# Patient Record
Sex: Male | Born: 1970
Health system: Southern US, Community
[De-identification: ages and names within clinical notes are randomized; demographics above are authoritative.]

## PROBLEM LIST (undated history)

## (undated) DIAGNOSIS — K760 Fatty (change of) liver, not elsewhere classified: Secondary | ICD-10-CM

## (undated) DIAGNOSIS — R7989 Other specified abnormal findings of blood chemistry: Secondary | ICD-10-CM

## (undated) DIAGNOSIS — N200 Calculus of kidney: Secondary | ICD-10-CM

## (undated) DIAGNOSIS — K589 Irritable bowel syndrome without diarrhea: Secondary | ICD-10-CM

## (undated) DIAGNOSIS — E669 Obesity, unspecified: Secondary | ICD-10-CM

## (undated) DIAGNOSIS — K219 Gastro-esophageal reflux disease without esophagitis: Secondary | ICD-10-CM

## (undated) DIAGNOSIS — E785 Hyperlipidemia, unspecified: Secondary | ICD-10-CM

## (undated) DIAGNOSIS — R0683 Snoring: Secondary | ICD-10-CM

## (undated) HISTORY — DX: Snoring: R06.83

## (undated) HISTORY — DX: Obesity, unspecified: E66.9

## (undated) HISTORY — DX: Calculus of kidney: N20.0

## (undated) HISTORY — PX: NO PAST SURGERIES: SHX2092

## (undated) HISTORY — DX: Hyperlipidemia, unspecified: E78.5

## (undated) HISTORY — DX: Fatty (change of) liver, not elsewhere classified: K76.0

## (undated) HISTORY — DX: Other specified abnormal findings of blood chemistry: R79.89

## (undated) HISTORY — DX: Irritable bowel syndrome, unspecified: K58.9

## (undated) HISTORY — DX: Gastro-esophageal reflux disease without esophagitis: K21.9

---

## 2005-07-03 ENCOUNTER — Ambulatory Visit: Payer: Self-pay | Admitting: Gastroenterology

## 2005-07-16 ENCOUNTER — Ambulatory Visit: Payer: Self-pay | Admitting: Gastroenterology

## 2005-07-30 ENCOUNTER — Ambulatory Visit: Payer: Self-pay | Admitting: Gastroenterology

## 2005-08-06 ENCOUNTER — Ambulatory Visit: Payer: Self-pay | Admitting: Gastroenterology

## 2005-08-06 HISTORY — PX: COLONOSCOPY: SHX174

## 2007-06-24 ENCOUNTER — Ambulatory Visit: Payer: Self-pay | Admitting: Gastroenterology

## 2007-06-24 LAB — CONVERTED CEMR LAB
ALT: 42 U/L
AST: 26 U/L
Albumin: 4 g/dL
Alkaline Phosphatase: 90 U/L
Amylase: 203 U/L — ABNORMAL HIGH
BUN: 14 mg/dL
Basophils Absolute: 0.1 K/uL
Basophils Relative: 0.7 %
Bilirubin, Direct: 0.1 mg/dL
CO2: 30 meq/L
Calcium: 9.3 mg/dL
Chloride: 106 meq/L
Creatinine, Ser: 1 mg/dL
Eosinophils Absolute: 1.1 K/uL — ABNORMAL HIGH
Eosinophils Relative: 14.7 % — ABNORMAL HIGH
Ferritin: 126.5 ng/mL
Folate: 13.7 ng/mL
GFR calc Af Amer: 108 mL/min
GFR calc non Af Amer: 89 mL/min
Glucose, Bld: 112 mg/dL — ABNORMAL HIGH
H Pylori IgG: POSITIVE — AB
HCT: 40.2 %
Hemoglobin: 14.6 g/dL
Iron: 92 ug/dL
Lipase: 21 U/L
Lymphocytes Relative: 34.4 %
MCHC: 36.2 g/dL
MCV: 93.9 fL
Monocytes Absolute: 0.5 K/uL
Monocytes Relative: 6.8 %
Neutro Abs: 3.1 K/uL
Neutrophils Relative %: 43.4 %
Platelets: 179 K/uL
Potassium: 3.5 meq/L
RBC: 4.28 M/uL
RDW: 11.3 % — ABNORMAL LOW
Saturation Ratios: 27 %
Sed Rate: 8 mm/h
Sodium: 142 meq/L
TSH: 0.84 u[IU]/mL
Total Bilirubin: 0.9 mg/dL
Total Protein: 7.6 g/dL
Transferrin: 243.1 mg/dL
Vitamin B-12: 480 pg/mL
WBC: 7.3 10*3/microliter

## 2007-07-07 ENCOUNTER — Ambulatory Visit: Payer: Self-pay | Admitting: Cardiology

## 2008-01-30 DIAGNOSIS — Z8719 Personal history of other diseases of the digestive system: Secondary | ICD-10-CM | POA: Insufficient documentation

## 2008-01-30 DIAGNOSIS — K589 Irritable bowel syndrome without diarrhea: Secondary | ICD-10-CM | POA: Insufficient documentation

## 2009-05-21 ENCOUNTER — Emergency Department (HOSPITAL_COMMUNITY): Admission: EM | Admit: 2009-05-21 | Discharge: 2009-05-21 | Payer: Self-pay | Admitting: Emergency Medicine

## 2010-11-28 NOTE — Procedures (Signed)
Summary: Gastroenterology colon  Gastroenterology colon   Imported By: Milford Cage CMA 01/30/2008 11:12:03  _____________________________________________________________________  External Attachment:    Type:   Image     Comment:   External Document

## 2010-11-28 NOTE — Procedures (Signed)
Summary: Gastroenterology EGD  Gastroenterology EGD   Imported By: Milford Cage CMA 01/30/2008 11:12:41  _____________________________________________________________________  External Attachment:    Type:   Image     Comment:   External Document

## 2011-02-04 LAB — URINE MICROSCOPIC-ADD ON

## 2011-02-04 LAB — URINALYSIS, ROUTINE W REFLEX MICROSCOPIC
Bilirubin Urine: NEGATIVE
Glucose, UA: NEGATIVE mg/dL
Ketones, ur: NEGATIVE mg/dL
Leukocytes, UA: NEGATIVE
Nitrite: NEGATIVE
Protein, ur: NEGATIVE mg/dL
Specific Gravity, Urine: 1.024 (ref 1.005–1.030)
Urobilinogen, UA: 0.2 mg/dL (ref 0.0–1.0)
pH: 6 (ref 5.0–8.0)

## 2011-02-04 LAB — URINE CULTURE
Colony Count: NO GROWTH
Culture: NO GROWTH

## 2011-03-13 NOTE — Assessment & Plan Note (Signed)
[facility]                         GASTROENTEROLOGY OFFICE NOTE   NAME:[name] [name]                     MRN:          [id number]  DATE:06/24/2007                            DOB:          [birth year]    I previously saw [name] [name] in September of 2006.  He currently is a  40 year old Hispanic male who has recurrent left-sided abdominal pain of  unexplained etiology with negative workup including endoscopy and  colonoscopy and upper abdominal ultrasound, along with multiple  laboratory parameters in 2006.  He at that time was treated empirically  with Prevacid and anti-spasmodics and apparently his left-sided  abdominal pain resolved.   He comes in today with his wife and is complaining of similar pain which  seemed to occur every several days and will last up to a day in  duration.  Described as a dull and crampy pain, without precipitating or  alleviating elements.  He surprisingly had no change in his bowel  pattern and denies melena or hematochezia.  Had some vague indigestion  but denies nausea and vomiting, anorexia, weight loss or any  hepatobiliary complaints.  His review of systems otherwise  noncontributory.  He denies abuse of cigarettes, alcohol, or NSAIDs.   As per my previous notes, his family history is noncontributory.   EXAMINATION:  He is a healthy-appearing Hispanic male appearing his  stated age in no acute distress.  He weighs 207 pounds which is up 6  pounds from his previous weight.  Blood pressure is 124/64.  Could not  appreciate stigmata of chronic liver disease.  ABDOMINAL:  Showed no organomegaly, masses, but there was slight  tenderness in the left lower quadrant over his sigmoid colon.  Bowel  sounds were normal.  RECTAL:  Exam was deferred.   ASSESSMENT:  [name] [name] seemed to have some tenderness over his  sigmoid colon but this is chronic in nature and he has had a negative  colonoscopy recently 2 years ago.   I suspect he has a variation of  irritable bowel syndrome.   RECOMMENDATIONS:  1. Check CT scan of abdomen and pelvis to complete his workup.  2. Screening laboratory parameters.  3. High-fiber diet with fiber supplements.  4. Pamine forte 5 mg q.a.m. and twice a day as needed.  5. Followup in several weeks time and we will proceed according to his      workup, clinical course.     Vania Rea. Jarold Motto, MD, Caleen Essex, FAGA  Electronically Signed    DRP/MedQ  DD: 06/24/2007  DT: 06/25/2007  Job #: [id number]

## 2012-12-03 ENCOUNTER — Encounter: Payer: Self-pay | Admitting: Physician Assistant

## 2013-04-29 NOTE — Progress Notes (Signed)
This encounter was created in error - please disregard.

## 2014-10-23 ENCOUNTER — Ambulatory Visit (INDEPENDENT_AMBULATORY_CARE_PROVIDER_SITE_OTHER): Payer: BC Managed Care – PPO | Admitting: Family Medicine

## 2014-10-23 VITALS — BP 130/80 | HR 79 | Temp 98.7°F | Resp 16 | Ht 67.25 in | Wt 224.0 lb

## 2014-10-23 DIAGNOSIS — J0100 Acute maxillary sinusitis, unspecified: Secondary | ICD-10-CM

## 2014-10-23 DIAGNOSIS — R05 Cough: Secondary | ICD-10-CM

## 2014-10-23 DIAGNOSIS — R059 Cough, unspecified: Secondary | ICD-10-CM

## 2014-10-23 MED ORDER — MUCINEX DM MAXIMUM STRENGTH 60-1200 MG PO TB12: 1.0000 | ORAL_TABLET | Freq: Two times a day (BID) | ORAL | Status: DC

## 2014-10-23 MED ORDER — IPRATROPIUM BROMIDE 0.03 % NA SOLN: 2.0000 | Freq: Two times a day (BID) | NASAL | Status: DC

## 2014-10-23 MED ORDER — AMOXICILLIN-POT CLAVULANATE 875-125 MG PO TABS: 1.0000 | ORAL_TABLET | Freq: Two times a day (BID) | ORAL | Status: DC

## 2014-10-23 NOTE — Patient Instructions (Signed)
Sinusitis (Sinusitis) La sinusitis es el enrojecimiento, el dolor y la inflamacin de los senos paranasales. Los senos paranasales son bolsas de aire que se encuentran dentro de los huesos de la cara (por debajo de los ojos, en la mitad de la frente o por encima de los ojos). En los senos paranasales sanos, el moco puede drenar y el aire circula a travs de ellos en su camino hacia la Lawyer. Sin embargo, cuando se [place], el moco y el aire [place]. Esto hace que se desarrollen bacterias y otros grmenes que causan infeccin. La sinusitis puede desarrollarse rpidamente y durar solo un tiempo corto (aguda) o continuar por un perodo largo (crnica). La sinusitis que dura ms de 12 semanas se considera crnica.  CAUSAS  Las causas de la sinusitis son:  Cualquier alergia que tenga.  Las [employer], como el desplazamiento del cartlago que separa las fosas nasales (desvo del tabique), que pueden disminuir el flujo de aire por la nariz y los senos paranasales, y Print production planner su drenaje.  Las anomalas funcionales, como cuando los pequeos pelos (cilias) que se encuentran en los senos paranasales y que ayudan a eliminar el moco no funcionan correctamente o no estn presentes. [place] sntomas de la sinusitis aguda y [place] son los mismos. Los sntomas principales son Conservation officer, historic buildings y la presin alrededor de los senos paranasales afectados. Otros sntomas son:  Chief of Staff.  Dolor de odos.  Dolor de [place].  Mal aliento.  Disminucin del sentido del olfato y del gusto.  Tos, que empeora al [employer].  Fatiga.  Cristy Hilts.  Drenaje de moco espeso por la nariz, que generalmente es de color verde y puede contener pus (purulento).  Hinchazn y calor en los senos paranasales afectados. DIAGNSTICO  Su mdico le Chartered certified accountant examen fsico. Durante el examen, el mdico:  Le revisar la nariz para buscar signos de crecimientos anormales en las  fosas nasales (plipos nasales).  Palpar los senos paranasales afectados para buscar signos de infeccin.  Ver el interior de los senos paranasales (endoscopia) a travs de un dispositivo de obtencin de imgenes que tiene una luz conectada (endoscopio). Si el mdico sospecha que usted sufre sinusitis crnica, podr indicar una o ms de las siguientes pruebas:  Pruebas de Buyer, retail.  Cultivo de las [employer]. Se extrae Truddie Coco de moco de la nariz, que se enva al laboratorio para detectar bacterias.  Citologa nasal. Se extrae Truddie Coco de moco de la nariz, que el mdico examina para determinar si la sinusitis est relacionada con Obie Dredge. TRATAMIENTO  La mayora de los casos de sinusitis aguda se deben a una infeccin viral y se resuelven espontneamente en un perodo de 10das. En algunos casos, se recetan medicamentos para [employer] (analgsicos, descongestivos, aerosoles nasales con corticoides o aerosoles salinos).  Sin embargo, para la sinusitis por infeccin bacteriana, Camera operator. Los antibiticos son medicamentos que destruyen las bacterias que causan la infeccin.  Con poca frecuencia, la sinusitis tiene su origen en una infeccin por hongos. En estos casos, el mdico recetar un medicamento antimictico. Para algunos casos de sinusitis crnica, es necesario someterse a [place]. Generalmente, se trata de [employer] la sinusitis se repite ms de 3veces al ao, a pesar de otros tratamientos. INSTRUCCIONES PARA EL CUIDADO EN EL HOGAR   Beber gran cantidad de lquidos. Los lquidos ayudan a [employer], para que drene ms fcilmente de los senos paranasales.  Use un humidificador.  Inhale vapor de 3 a 4 veces al da (por ejemplo, sintese en el bao con la ducha abierta).  Aplquese un pao tibio y hmedo en la cara 3 o 4 veces [place] indicaciones del mdico.  Use un aerosol nasal salino para ayudar  a Air cabin crew y [employer] senos nasales.  Tome los medicamentos solamente como se lo haya indicado el mdico.  Si le recetaron un antimictico o un antibitico, asegrese de terminarlos, incluso si comienza a sentirse mejor. SOLICITE ATENCIN MDICA DE INMEDIATO SI:  Siente ms dolor o sufre dolores de cabeza intensos.  Tiene nuseas, vmitos o somnolencia.  Observa hinchazn alrededor del rostro.  Tiene problemas de visin.  Presenta rigidez en el cuello.  Tiene dificultad para respirar. ASEGRESE DE QUE:   Comprende estas instrucciones.  Controlar su afeccin.  Recibir ayuda de inmediato si no mejora o si empeora. Document Released: 07/25/2005 Document Revised: 03/01/2014 [facility] Patient Information 2015 [place]. This information is not intended to replace advice given to you by your health care provider. Make sure you discuss any questions you have with your health care provider.

## 2014-10-23 NOTE — Progress Notes (Signed)
Subjective:    Patient ID: [name] [name], male    DOB: 08-Dec-1970, 43 y.o.   MRN: [id number]  10/23/2014  Sore Throat and Cough   HPI This 43 y.o. male presents for evaluation of sore throat and cough.  Started with sore throat and now coughing.  No fever but +chills/sweats in begninning.  +HA.  Decreased hearing.  No rhinorrhea; +nasal congestion.  +coughing; +sputum.  +wheezing.  No SOB.  No v/d.  Dayquil and Nyquil without improvement.  No improvement.  No tobacco.  Drainage from L ear one night; no bloody drainage.  Hearing decreased after drainage from L ear.  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    Three children at home are sick.     Review of Systems  Constitutional: Positive for chills, diaphoresis and fatigue. Negative for fever.  HENT: Positive for congestion, ear discharge, hearing loss, postnasal drip and sore throat. Negative for rhinorrhea, sinus pressure, trouble swallowing and voice change.   Respiratory: Positive for cough and wheezing. Negative for shortness of breath.   Gastrointestinal: Negative for nausea, vomiting, abdominal pain and diarrhea.  Skin: Negative for rash.  Neurological: Positive for headaches.    History reviewed. No pertinent past medical history. History reviewed. No pertinent past surgical history. No Known Allergies      Objective:    BP 130/80 mmHg  Pulse 79  Temp(Src) 98.7 F (37.1 C) (Oral)  Resp 16  Ht 5' 7.25" (1.708 m)  Wt 224 lb (101.606 kg)  BMI 34.83 kg/m2  SpO2 95% Physical Exam  Constitutional: He is oriented to person, place, and time. He appears well-developed and well-nourished. No distress.  HENT:  Head: Normocephalic and atraumatic.  Right Ear: External ear normal. Tympanic membrane is not perforated, not erythematous, not retracted and not bulging.  Left Ear: Tympanic membrane is retracted. Tympanic membrane is not perforated, not erythematous and not bulging.  Nose: Mucosal edema and rhinorrhea present. Right sinus  exhibits maxillary sinus tenderness. Right sinus exhibits no frontal sinus tenderness. Left sinus exhibits maxillary sinus tenderness. Left sinus exhibits no frontal sinus tenderness.  Mouth/Throat: Oropharynx is clear and moist. No oropharyngeal exudate.  Eyes: Conjunctivae and EOM are normal. Pupils are equal, round, and reactive to light.  Neck: Normal range of motion. Neck supple. Carotid bruit is not present. No thyromegaly present.  Cardiovascular: Normal rate, regular rhythm, normal heart sounds and intact distal pulses.  Exam reveals no gallop and no friction rub.   No murmur heard. Pulmonary/Chest: Effort normal and breath sounds normal. He has no wheezes. He has no rales.  Lymphadenopathy:    He has no cervical adenopathy.  Neurological: He is alert and oriented to person, place, and time. No cranial nerve deficit.  Skin: Skin is warm and dry. No rash noted. He is not diaphoretic.  Psychiatric: He has a normal mood and affect. His behavior is normal.  Nursing note and vitals reviewed.       Assessment & Plan:   1. Acute maxillary sinusitis, recurrence not specified   2. Cough     Meds ordered this encounter  Medications  . amoxicillin-clavulanate (AUGMENTIN) 875-125 MG per tablet    Sig: Take 1 tablet by mouth 2 (two) times daily.    Dispense:  20 tablet    Refill:  0  . ipratropium (ATROVENT) 0.03 % nasal spray    Sig: Place 2 sprays into the nose 2 (two) times daily.    Dispense:  30 mL    Refill:  0  . Dextromethorphan-Guaifenesin (MUCINEX DM MAXIMUM STRENGTH) 60-1200 MG TB12    Sig: Take 1 tablet by mouth every 12 (twelve) hours.    Dispense:  20 each    Refill:  0    No Follow-up on file.    Reginia Forts, M.D.  [facility] [address] [place], [place]  [address] [phone] phone [phone] fax

## 2014-12-01 ENCOUNTER — Ambulatory Visit (INDEPENDENT_AMBULATORY_CARE_PROVIDER_SITE_OTHER): Payer: 59

## 2014-12-01 ENCOUNTER — Ambulatory Visit (INDEPENDENT_AMBULATORY_CARE_PROVIDER_SITE_OTHER): Payer: 59 | Admitting: Emergency Medicine

## 2014-12-01 VITALS — BP 132/82 | HR 72 | Temp 97.8°F | Resp 18 | Ht 68.0 in | Wt 223.0 lb

## 2014-12-01 DIAGNOSIS — R059 Cough, unspecified: Secondary | ICD-10-CM

## 2014-12-01 DIAGNOSIS — R05 Cough: Secondary | ICD-10-CM

## 2014-12-01 LAB — POCT CBC
Granulocyte percent: 62.3 %G (ref 37–80)
HCT, POC: 46.3 % (ref 43.5–53.7)
Hemoglobin: 15.2 g/dL (ref 14.1–18.1)
Lymph, poc: 1.8 (ref 0.6–3.4)
MCH, POC: 32.3 pg — AB (ref 27–31.2)
MCHC: 32.9 g/dL (ref 31.8–35.4)
MCV: 98.1 fL — AB (ref 80–97)
MID (cbc): 0.5 (ref 0–0.9)
MPV: 8.8 fL (ref 0–99.8)
POC Granulocyte: 3.8 (ref 2–6.9)
POC LYMPH PERCENT: 30.3 %L (ref 10–50)
POC MID %: 7.4 %M (ref 0–12)
Platelet Count, POC: 198 10*3/uL (ref 142–424)
RBC: 4.71 M/uL (ref 4.69–6.13)
RDW, POC: 12.6 %
WBC: 6.1 10*3/uL (ref 4.6–10.2)

## 2014-12-01 MED ORDER — BENZONATATE 100 MG PO CAPS: 100.0000 mg | ORAL_CAPSULE | Freq: Three times a day (TID) | ORAL | Status: DC | PRN

## 2014-12-01 MED ORDER — AZITHROMYCIN 250 MG PO TABS: ORAL_TABLET | ORAL | Status: DC

## 2014-12-01 NOTE — Progress Notes (Signed)
Subjective:    Patient ID: [name] [name], male    DOB: 08-01-1971, 44 y.o.   MRN: [id number] This chart was scribed for [name] Queen, MD by Zola Button, Medical Scribe. This patient was seen in room 9 and the patient's care was started at 11:02 AM.   HPI HPI Comments: [place] [name] [name] is a 44 y.o. male who presents to the [facility] complaining of gradual onset URI symptoms that started 2 weeks ago. Patient reports having worsening productive cough with yellow/clear phlegm, congestion, and sore throat. He has been using Nyquil and Dayquil for his symptoms. He denies smoking. Patient was seen here by Dr. Tamala Julian and treated for sinusitis on 10/23/14 with improvement.  He is driving to [place], [place] today for work.   Review of Systems  HENT: Positive for congestion and sore throat.   Respiratory: Positive for cough.        Objective:   Physical Exam CONSTITUTIONAL: Well developed/well nourished. Alert and cooperative. HEAD: Normocephalic/atraumatic EYES: EOM/PERRL ENMT: Mucous membranes moist. Ears normal. Throat slightly red.  NECK: No adenopathy. SPINE: entire spine nontender CV: S1/S2 noted, no murmurs/rubs/gallops noted LUNGS: Occasional rhonchi, but good breath sounds. Clear to auscultation. ABDOMEN: soft, nontender, no rebound or guarding GU: no cva tenderness NEURO: Pt is awake/alert, moves all extremitiesx4 EXTREMITIES: pulses normal, full ROM SKIN: warm, color normal PSYCH: no abnormalities of mood noted  Results for orders placed or performed in visit on 12/01/14  POCT CBC  Result Value Ref Range   WBC 6.1 4.6 - 10.2 K/uL   Lymph, poc 1.8 0.6 - 3.4   POC LYMPH PERCENT 30.3 10 - 50 %L   MID (cbc) 0.5 0 - 0.9   POC MID % 7.4 0 - 12 %M   POC Granulocyte 3.8 2 - 6.9   Granulocyte percent 62.3 37 - 80 %G   RBC 4.71 4.69 - 6.13 M/uL   Hemoglobin 15.2 14.1 - 18.1 g/dL   HCT, POC 46.3 43.5 - 53.7 %   MCV 98.1 (A) 80 - 97 fL   MCH, POC 32.3  (A) 27 - 31.2 pg   MCHC 32.9 31.8 - 35.4 g/dL   RDW, POC 12.6 %   Platelet Count, POC 198 142 - 424 K/uL   MPV 8.8 0 - 99.8 fL     [facility] (PRIMARY) x-ray report read by Dr. Everlene Farrier: There is prominent hilar area. There appears to be evidence of old granulomatous disease. Please comment whether any follow-up as necessary on his chest x-ray. Results for orders placed or performed in visit on 12/01/14  POCT CBC  Result Value Ref Range   WBC 6.1 4.6 - 10.2 K/uL   Lymph, poc 1.8 0.6 - 3.4   POC LYMPH PERCENT 30.3 10 - 50 %L   MID (cbc) 0.5 0 - 0.9   POC MID % 7.4 0 - 12 %M   POC Granulocyte 3.8 2 - 6.9   Granulocyte percent 62.3 37 - 80 %G   RBC 4.71 4.69 - 6.13 M/uL   Hemoglobin 15.2 14.1 - 18.1 g/dL   HCT, POC 46.3 43.5 - 53.7 %   MCV 98.1 (A) 80 - 97 fL   MCH, POC 32.3 (A) 27 - 31.2 pg   MCHC 32.9 31.8 - 35.4 g/dL   RDW, POC 12.6 %   Platelet Count, POC 198 142 - 424 K/uL   MPV 8.8 0 - 99.8 fL       Assessment &  Plan:  Patient has already completed a course of Augmentin. Will treat with a Z-pack and Tessalon pearls.  I personally performed the services described in this documentation, which was scribed in my presence. The recorded information has been reviewed and is accurate.

## 2014-12-01 NOTE — Patient Instructions (Signed)

## 2015-06-25 ENCOUNTER — Encounter: Payer: Self-pay | Admitting: Internal Medicine

## 2016-05-09 ENCOUNTER — Emergency Department (HOSPITAL_BASED_OUTPATIENT_CLINIC_OR_DEPARTMENT_OTHER): Payer: 59

## 2016-05-09 ENCOUNTER — Encounter (HOSPITAL_BASED_OUTPATIENT_CLINIC_OR_DEPARTMENT_OTHER): Payer: Self-pay

## 2016-05-09 ENCOUNTER — Emergency Department (HOSPITAL_BASED_OUTPATIENT_CLINIC_OR_DEPARTMENT_OTHER)
Admission: EM | Admit: 2016-05-09 | Discharge: 2016-05-09 | Disposition: A | Discharge status: Home / Self Care | Payer: 59 | Attending: Emergency Medicine | Admitting: Emergency Medicine

## 2016-05-09 DIAGNOSIS — R319 Hematuria, unspecified: Secondary | ICD-10-CM

## 2016-05-09 DIAGNOSIS — N23 Unspecified renal colic: Secondary | ICD-10-CM

## 2016-05-09 DIAGNOSIS — N200 Calculus of kidney: Secondary | ICD-10-CM | POA: Diagnosis not present

## 2016-05-09 DIAGNOSIS — R109 Unspecified abdominal pain: Secondary | ICD-10-CM | POA: Diagnosis present

## 2016-05-09 HISTORY — DX: Calculus of kidney: N20.0

## 2016-05-09 LAB — URINE MICROSCOPIC-ADD ON

## 2016-05-09 LAB — CBC WITH DIFFERENTIAL/PLATELET
Basophils Absolute: 0 10*3/uL (ref 0.0–0.1)
Basophils Relative: 0 %
Eosinophils Absolute: 0.2 10*3/uL (ref 0.0–0.7)
Eosinophils Relative: 2 %
HCT: 41.6 % (ref 39.0–52.0)
Hemoglobin: 15.1 g/dL (ref 13.0–17.0)
Lymphocytes Relative: 17 %
Lymphs Abs: 1.7 10*3/uL (ref 0.7–4.0)
MCH: 34.2 pg — ABNORMAL HIGH (ref 26.0–34.0)
MCHC: 36.3 g/dL — ABNORMAL HIGH (ref 30.0–36.0)
MCV: 94.1 fL (ref 78.0–100.0)
Monocytes Absolute: 0.9 10*3/uL (ref 0.1–1.0)
Monocytes Relative: 9 %
Neutro Abs: 7 10*3/uL (ref 1.7–7.7)
Neutrophils Relative %: 72 %
Platelets: 166 10*3/uL (ref 150–400)
RBC: 4.42 MIL/uL (ref 4.22–5.81)
RDW: 11.5 % (ref 11.5–15.5)
WBC: 9.8 10*3/uL (ref 4.0–10.5)

## 2016-05-09 LAB — URINALYSIS, ROUTINE W REFLEX MICROSCOPIC
Bilirubin Urine: NEGATIVE
Glucose, UA: NEGATIVE mg/dL
Ketones, ur: 15 mg/dL — AB
Nitrite: NEGATIVE
Protein, ur: 30 mg/dL — AB
Specific Gravity, Urine: 1.024 (ref 1.005–1.030)
pH: 5.5 (ref 5.0–8.0)

## 2016-05-09 LAB — BASIC METABOLIC PANEL
Anion gap: 9 (ref 5–15)
BUN: 18 mg/dL (ref 6–20)
CO2: 25 mmol/L (ref 22–32)
Calcium: 9.6 mg/dL (ref 8.9–10.3)
Chloride: 102 mmol/L (ref 101–111)
Creatinine, Ser: 1.21 mg/dL (ref 0.61–1.24)
GFR calc Af Amer: >60 mL/min (ref >60)
GFR calc non Af Amer: >60 mL/min (ref >60)
Glucose, Bld: 109 mg/dL — ABNORMAL HIGH (ref 65–99)
Potassium: 4.1 mmol/L (ref 3.5–5.1)
Sodium: 136 mmol/L (ref 135–145)

## 2016-05-09 MED ORDER — ONDANSETRON HCL 4 MG PO TABS: 4.0000 mg | ORAL_TABLET | Freq: Three times a day (TID) | ORAL | Status: DC | PRN

## 2016-05-09 MED ORDER — HYDROCODONE-ACETAMINOPHEN 5-325 MG PO TABS: 1.0000 | ORAL_TABLET | ORAL | Status: DC | PRN

## 2016-05-09 NOTE — ED Notes (Signed)
Pt describes left flank pain onset last p.m. Noticed blood in urine. Decreased amts and harder to pass. Pain comes in waves. No pain or distress at present.

## 2016-05-09 NOTE — ED Notes (Signed)
Pt returned from [facility]

## 2016-05-09 NOTE — Discharge Instructions (Signed)
Your ultrasound showed stones in the kidney. You most likely passed a stone today. I am sending you home with pain medication and nausea medicine. Return to the ED immediately if you develop fever, uncontrolled pain or vomiting, or other concerns.   Hematuria, Adult Hematuria is blood in your urine. It can be caused by a bladder infection, kidney infection, prostate infection, kidney stone, or cancer of your urinary tract. Infections can usually be treated with medicine, and a kidney stone usually will pass through your urine. If neither of these is the cause of your hematuria, further workup to find out the reason may be needed. It is very important that you tell your health care provider about any blood you see in your urine, even if the blood stops without treatment or happens without causing pain. Blood in your urine that happens and then stops and then happens again can be a symptom of a very serious condition. Also, pain is not a symptom in the initial stages of many urinary cancers. HOME CARE INSTRUCTIONS   Drink lots of fluid, 3-4 quarts a day. If you have been diagnosed with an infection, cranberry juice is especially recommended, in addition to large amounts of water.  Avoid caffeine, tea, and carbonated beverages because they tend to irritate the bladder.  Avoid alcohol because it may irritate the prostate.  Take all medicines as directed by your health care provider.  If you were prescribed an antibiotic medicine, finish it all even if you start to feel better.  If you have been diagnosed with a kidney stone, follow your health care provider's instructions regarding straining your urine to catch the stone.  Empty your bladder often. Avoid holding urine for long periods of time.  After a bowel movement, women should cleanse front to back. Use each tissue only once.  Empty your bladder before and after sexual intercourse if you are a male. SEEK MEDICAL CARE IF:  You develop  back pain.  You have a fever.  You have a feeling of sickness in your stomach (nausea) or vomiting.  Your symptoms are not better in 3 days. Return sooner if you are getting worse. SEEK IMMEDIATE MEDICAL CARE IF:   You develop severe vomiting and are unable to keep the medicine down.  You develop severe back or abdominal pain despite taking your medicines.  You begin passing a large amount of blood or clots in your urine.  You feel extremely weak or faint, or you pass out. MAKE SURE YOU:   Understand these instructions.  Will watch your condition.  Will get help right away if you are not doing well or get worse.   This information is not intended to replace advice given to you by your health care provider. Make sure you discuss any questions you have with your health care provider.   Document Released: 10/15/2005 Document Revised: 11/05/2014 Document Reviewed: 06/15/2013 Elsevier Interactive Patient Education 2016 [place].  Renal Colic Renal colic is pain that is caused by passing a kidney stone. The pain can be sharp and severe. It may be felt in the back, abdomen, side (flank), or groin. It can cause nausea. Renal colic can come and go. HOME CARE INSTRUCTIONS Watch your condition for any changes. The following actions may help to lessen any discomfort that you are feeling:  Take medicines only as directed by your health care provider.  Ask your health care provider if it is okay to take over-the-counter pain medicine.  Drink enough fluid  to keep your urine clear or pale yellow. Drink 6-8 glasses of water each day.  Limit the amount of salt that you eat to less than 2 grams per day.  Reduce the amount of protein in your diet. Eat less meat, fish, nuts, and dairy.  Avoid foods such as spinach, rhubarb, nuts, or bran. These may make kidney stones more likely to form. SEEK MEDICAL CARE IF:  You have a fever or chills.  Your urine smells bad or looks cloudy.  You  have pain or burning when you pass urine. SEEK IMMEDIATE MEDICAL CARE IF:  Your flank pain or groin pain suddenly worsens.  You become confused or disoriented or you lose consciousness.   This information is not intended to replace advice given to you by your health care provider. Make sure you discuss any questions you have with your health care provider.   Document Released: 07/25/2005 Document Revised: 11/05/2014 Document Reviewed: 08/25/2014 Elsevier Interactive Patient Education [employer].

## 2016-05-09 NOTE — ED Notes (Signed)
Pt updated to wait time and plan of care.

## 2016-05-09 NOTE — ED Notes (Signed)
Updated pt to plan of care 

## 2016-05-09 NOTE — ED Notes (Signed)
Pt verbalizes understanding of d/c instructions and denies any further needs at this time. 

## 2016-05-09 NOTE — ED Notes (Signed)
Pt transported to [facility]

## 2016-05-09 NOTE — ED Provider Notes (Signed)
CSN: [id number]     Arrival date & time 05/09/16  1632 History  By signing my name below, I, Eustaquio Maize, attest that this documentation has been prepared under the direction and in the presence of Margarita Mail, PA-C.  Electronically Signed: Eustaquio Maize, ED Scribe. 05/09/2016. 7:20 PM.   Chief Complaint  Patient presents with  . Flank Pain   The history is provided by the patient. No language interpreter was used.    HPI Comments: [name] [name] is a 45 y.o. male who presents to the Emergency Department complaining of sudden onset, intermittent, severe, left flank pain radiating to LLQ that began last night around 1 AM. Pt does not have any pain at the moment. Pt also complains of hematuria. He does have a hx of kidney stones and states pain feels similar. No surgical intervention required in the past. Denies nausea, vomiting, or any other associated symptoms.   Past Medical History  Diagnosis Date  . Kidney stone    History reviewed. No pertinent past surgical history. No family history on file. Social History  Substance Use Topics  . Smoking status: Never Smoker   . Smokeless tobacco: None  . Alcohol Use: 0.0 oz/week    0 Standard drinks or equivalent per week     Comment: occ    Review of Systems A complete 10 system review of systems was obtained and all systems are negative except as noted in the HPI and PMH.   Allergies  Review of patient's allergies indicates no known allergies.  Home Medications   Prior to Admission medications   Medication Sig Start Date End Date Taking? Authorizing Provider  HYDROcodone-acetaminophen (NORCO) 5-325 MG tablet Take 1-2 tablets by mouth every 4 (four) hours as needed. 05/09/16   Margarita Mail, PA-C  ondansetron (ZOFRAN) 4 MG tablet Take 1 tablet (4 mg total) by mouth every 8 (eight) hours as needed for nausea or vomiting. 05/09/16   Margarita Mail, PA-C   BP 117/66 mmHg  Pulse 76  Temp(Src) 98.2 F (36.8 C) (Oral)  Resp  18  Ht 5\' 7"  (1.702 m)  Wt 102.059 kg  BMI 35.23 kg/m2  SpO2 99%   Physical Exam  Constitutional: He is oriented to person, place, and time. He appears well-developed and well-nourished. No distress.  HENT:  Head: Normocephalic and atraumatic.  Eyes: Conjunctivae and EOM are normal.  Neck: Neck supple. No tracheal deviation present.  Cardiovascular: Normal rate, regular rhythm and normal heart sounds.   Pulmonary/Chest: Effort normal and breath sounds normal. No respiratory distress. He has no rales.  Abdominal: There is tenderness.  Mild tenderness LLQ. No CVA tenderness.   Musculoskeletal: Normal range of motion.  Neurological: He is alert and oriented to person, place, and time.  Skin: Skin is warm and dry.  Psychiatric: He has a normal mood and affect. His behavior is normal.  Nursing note and vitals reviewed.   ED Course  Procedures (including critical care time)  DIAGNOSTIC STUDIES: Oxygen Saturation is 97% on RA, normal by my interpretation.    COORDINATION OF CARE: 7:17 PM-Discussed treatment plan which includes Renal US with pt at bedside and pt agreed to plan.   Labs Review Labs Reviewed  URINALYSIS, ROUTINE W REFLEX MICROSCOPIC (NOT AT [facility]) - Abnormal; Notable for the following:    APPearance TURBID (*)    Hgb urine dipstick LARGE (*)    Ketones, ur 15 (*)    Protein, ur 30 (*)    Leukocytes, UA SMALL (*)  All other components within normal limits  URINE MICROSCOPIC-ADD ON - Abnormal; Notable for the following:    Squamous Epithelial / LPF 0-5 (*)    Bacteria, UA FEW (*)    All other components within normal limits  BASIC METABOLIC PANEL - Abnormal; Notable for the following:    Glucose, Bld 109 (*)    All other components within normal limits  CBC WITH DIFFERENTIAL/PLATELET - Abnormal; Notable for the following:    MCH 34.2 (*)    MCHC 36.3 (*)    All other components within normal limits    Imaging Review No results found. I have personally  reviewed and evaluated these images and lab results as part of my medical decision-making.   EKG Interpretation None      MDM   Final diagnoses:  Hematuria  Renal colic  Nephrolithiasis   Patient w hematuria. No sign of infection. The patient has a negative renal US. No hx of stone too large to pass.  i suspect that the patient recently passed a stone. He has had no pain since being in the ed.  Patient will be discharged with pain meds and nausea meds. F/u with [facility]    I personally performed the services described in this documentation, which was scribed in my presence. The recorded information has been reviewed and is accurate.        Margarita Mail, PA-C 05/12/16 [place], MD 05/15/16 [phone]

## 2016-05-09 NOTE — ED Notes (Signed)
C/o left flank, abd pain since last night-"kidney stone" pain-NAD-steady gait

## 2016-05-09 NOTE — ED Notes (Signed)
PA at bedside.

## 2016-05-10 MED FILL — ONDANSETRON HCL 4 MG TABLET: 4 | 3 days supply | Qty: 10 | Fill #0

## 2016-05-10 MED FILL — HYDROCODON-APAP 5-325: 5-325 | 2 days supply | Qty: 10 | Fill #0

## 2016-06-21 DIAGNOSIS — R0789 Other chest pain: Secondary | ICD-10-CM | POA: Diagnosis not present

## 2016-06-25 ENCOUNTER — Ambulatory Visit (INDEPENDENT_AMBULATORY_CARE_PROVIDER_SITE_OTHER): Payer: 59 | Admitting: Family Medicine

## 2016-06-25 ENCOUNTER — Encounter: Payer: Self-pay | Admitting: Family Medicine

## 2016-06-25 VITALS — BP 133/89 | HR 64 | Temp 97.9°F | Resp 20 | Ht 67.0 in | Wt 231.8 lb

## 2016-06-25 DIAGNOSIS — Z7189 Other specified counseling: Secondary | ICD-10-CM

## 2016-06-25 DIAGNOSIS — Z23 Encounter for immunization: Secondary | ICD-10-CM

## 2016-06-25 DIAGNOSIS — Z136 Encounter for screening for cardiovascular disorders: Secondary | ICD-10-CM

## 2016-06-25 DIAGNOSIS — E669 Obesity, unspecified: Secondary | ICD-10-CM | POA: Insufficient documentation

## 2016-06-25 DIAGNOSIS — Z7689 Persons encountering health services in other specified circumstances: Secondary | ICD-10-CM

## 2016-06-25 DIAGNOSIS — Z1329 Encounter for screening for other suspected endocrine disorder: Secondary | ICD-10-CM

## 2016-06-25 DIAGNOSIS — Z114 Encounter for screening for human immunodeficiency virus [HIV]: Secondary | ICD-10-CM

## 2016-06-25 DIAGNOSIS — Z125 Encounter for screening for malignant neoplasm of prostate: Secondary | ICD-10-CM

## 2016-06-25 DIAGNOSIS — Z130 Encounter for screening for diseases of the blood and blood-forming organs and certain disorders involving the immune mechanism: Secondary | ICD-10-CM

## 2016-06-25 DIAGNOSIS — R0789 Other chest pain: Secondary | ICD-10-CM

## 2016-06-25 DIAGNOSIS — N200 Calculus of kidney: Secondary | ICD-10-CM | POA: Diagnosis not present

## 2016-06-25 DIAGNOSIS — Z6836 Body mass index (BMI) 36.0-36.9, adult: Secondary | ICD-10-CM

## 2016-06-25 DIAGNOSIS — Z0000 Encounter for general adult medical examination without abnormal findings: Secondary | ICD-10-CM

## 2016-06-25 DIAGNOSIS — Z13220 Encounter for screening for lipoid disorders: Secondary | ICD-10-CM

## 2016-06-25 NOTE — Progress Notes (Signed)
Patient ID: [name] [name], male  DOB: 05/19/1971, 45 y.o.   MRN: [id number] Patient Care Team    Relationship Specialty Notifications Start End  Ma Hillock, DO PCP - General Family Medicine  06/25/16     Subjective:  [name] [name] is a 46 y.o.  male present for new patient establishment With CPE All past medical history, surgical history, allergies, family history, immunizations, medications and social history were Obtained/updated in the electronic medical record today. All recent labs, ED visits and hospitalizations within the last year were reviewed.  Atypical chest pain: Patient was seen at a outside urgent care for atypical chest pain was located mid sternum without radiation. He states it was a fleeting quick pain. He denies diaphoresis, nausea or radiation of pain. He has a history of GERD and is not on medications at this time. He reported to a local emergency room near his employment in [place], which completed a EKG that was normal sinus rhythm. No lab results were collected. Patient denies continued chest pain. He denies dyspnea on exertion. Patient is a Theme park manager.  Patient was seen in the emergency room on 05/09/2016 as well. At this time he is having hematuria and renal colic, imaging study of the kidneys was completed which showed bilateral kidney stones present. Patient states that he has had 2 kidney stone attack in the last 4 years. He has not followed with urology.  Health maintenance:  Colonoscopy: completed 2006,  Screen 50, prior normal for pain. No fhx.  Immunizations: tdap unknown, Influenza 2016 (encouraged yearly) Infectious disease screening: Unknown PSA: No results found for: PSA. Discussed prostate screening testing, patient is amendable to having blood screening, does not desire GU exam.  Depression screen [facility] 2/9 06/25/2016  Decreased Interest 0  Down, Depressed, Hopeless 0  PHQ - 2 Score 0    Immunization History  Administered Date(s)  Administered  . Influenza-Unspecified 07/30/2015  . Tdap 06/25/2016    Past Medical History:  Diagnosis Date  . GERD (gastroesophageal reflux disease)   . IBS (irritable bowel syndrome)   . Kidney stone    No Known Allergies Past Surgical History:  Procedure Laterality Date  . NO PAST SURGERIES     Family History  Problem Relation Age of Onset  . Cirrhosis Paternal Grandfather    Social History   Social History  . Marital status: Married    Spouse name: N/A  . Number of children: N/A  . Years of education: N/A   Occupational History  . Not on file.   Social History Main Topics  . Smoking status: Never Smoker  . Smokeless tobacco: Never Used  . Alcohol use 0.0 oz/week     Comment: occ  . Drug use: No  . Sexual activity: Yes    Partners: Female    Birth control/ protection: None     Comment: Married   Other Topics Concern  . Not on file   Social History Narrative   Married to [place]. Has 3 children Cornelius Moras and [employer] school education. Works as a Theme park manager.   Drinks caffeine, uses herbal remedies.   Wears his seatbelt, smoke detectors in the house.   No firearms in the house.   Feels safe in his relationships.     Medication List    as of 06/25/2016  6:03 PM   You have not been prescribed any medications.      Recent Results (from the past 2160 hour(s))  Urinalysis, Routine w reflex microscopic (not at [facility])     Status: Abnormal   Collection Time: 05/09/16  4:40 PM  Result Value Ref Range   Color, Urine YELLOW YELLOW   APPearance TURBID (A) CLEAR   Specific Gravity, Urine 1.024 1.005 - 1.030   pH 5.5 5.0 - 8.0   Glucose, UA NEGATIVE NEGATIVE mg/dL   Hgb urine dipstick LARGE (A) NEGATIVE   Bilirubin Urine NEGATIVE NEGATIVE   Ketones, ur 15 (A) NEGATIVE mg/dL   Protein, ur 30 (A) NEGATIVE mg/dL   Nitrite NEGATIVE NEGATIVE   Leukocytes, UA SMALL (A) NEGATIVE  Urine microscopic-add on     Status: Abnormal   Collection Time:  05/09/16  4:40 PM  Result Value Ref Range   Squamous Epithelial / LPF 0-5 (A) NONE SEEN   WBC, UA 0-5 0 - 5 WBC/hpf   RBC / HPF TOO NUMEROUS TO COUNT 0 - 5 RBC/hpf   Bacteria, UA FEW (A) NONE SEEN   Urine-Other MUCOUS PRESENT   Basic metabolic panel     Status: Abnormal   Collection Time: 05/09/16  4:55 PM  Result Value Ref Range   Sodium 136 135 - 145 mmol/L   Potassium 4.1 3.5 - 5.1 mmol/L   Chloride 102 101 - 111 mmol/L   CO2 25 22 - 32 mmol/L   Glucose, Bld 109 (H) 65 - 99 mg/dL   BUN 18 6 - 20 mg/dL   Creatinine, Ser 1.21 0.61 - 1.24 mg/dL   Calcium 9.6 8.9 - 10.3 mg/dL   GFR calc non Af Amer >60 >60 mL/min   GFR calc Af Amer >60 >60 mL/min    Comment: (NOTE) The eGFR has been calculated using the CKD EPI equation. This calculation has not been validated in all clinical situations. eGFR's persistently <60 mL/min signify possible Chronic Kidney Disease.    Anion gap 9 5 - 15  CBC with Differential (PNL)     Status: Abnormal   Collection Time: 05/09/16  4:55 PM  Result Value Ref Range   WBC 9.8 4.0 - 10.5 K/uL   RBC 4.42 4.22 - 5.81 MIL/uL   Hemoglobin 15.1 13.0 - 17.0 g/dL   HCT 41.6 39.0 - 52.0 %   MCV 94.1 78.0 - 100.0 fL   MCH 34.2 (H) 26.0 - 34.0 pg   MCHC 36.3 (H) 30.0 - 36.0 g/dL   RDW 11.5 11.5 - 15.5 %   Platelets 166 150 - 400 K/uL   Neutrophils Relative % 72 %   Neutro Abs 7.0 1.7 - 7.7 K/uL   Lymphocytes Relative 17 %   Lymphs Abs 1.7 0.7 - 4.0 K/uL   Monocytes Relative 9 %   Monocytes Absolute 0.9 0.1 - 1.0 K/uL   Eosinophils Relative 2 %   Eosinophils Absolute 0.2 0.0 - 0.7 K/uL   Basophils Relative 0 %   Basophils Absolute 0.0 0.0 - 0.1 K/uL    US Renal  Result Date: 05/09/2016 CLINICAL DATA:  Left flank pain radiating to the left pelvis EXAM: RENAL / URINARY TRACT ULTRASOUND COMPLETE COMPARISON:  None. FINDINGS: Right Kidney: Length: 12.4 cm. Echogenicity within normal limits. 6 mm calculus in the interpolar aspect of the right kidney. No mass  or hydronephrosis visualized. Left Kidney: Length: 12.1 cm. Echogenicity within normal limits. Multiple echogenic foci in the left kidney consistent with nephrolithiasis. No mass or hydronephrosis visualized. Bladder: Appears normal for degree of bladder distention. IMPRESSION: 1. Bilateral nonobstructing renal calculi. Electronically Signed   By:  Kathreen Devoid   On: 05/09/2016 21:00   ROS: 14 pt review of systems performed and negative (unless mentioned in an HPI)  Objective: BP 133/89 (BP Location: Left Arm, Patient Position: Sitting, Cuff Size: Large)   Pulse 64   Temp 97.9 F (36.6 C) (Oral)   Resp 20   Ht '5\' 7"'$  (1.702 m)   Wt 231 lb 12.8 oz (105.1 kg)   SpO2 98%   BMI 36.31 kg/m  Gen: Afebrile. No acute distress. Nontoxic in appearance, well-developed, well-nourished,  obese male HENT: AT. [place]. Bilateral TM visualized and normal in appearance, normal external auditory canal. MMM, no oral lesions, adequate dentition. Bilateral nares within normal limits. Throat without erythema, ulcerations or exudates. No Cough on exam, no hoarseness on exam. Eyes:Pupils Equal Round Reactive to light, Extraocular movements intact,  Conjunctiva without redness, discharge or icterus. Neck/lymp/endocrine: Supple, no lymphadenopathy, no thyromegaly CV: RRR no murmur appreciated, no edema, +2/4 P posterior tibialis pulses. No carotid bruits. No JVD. Chest: CTAB, no wheeze, rhonchi or crackles. Normal Respiratory effort. Good Air movement. Abd: Soft. Obese. NTND. BS present. No Masses palpated. No hepatosplenomegaly. No rebound tenderness or guarding. Skin: No rashes, purpura or petechiae. Warm and well-perfused. Skin intact. Neuro/Msk:  Normal gait. PERLA. EOMi. Alert. Oriented x3.  Cranial nerves II through XII intact. Muscle strength 5/5 upper/lower extremity. DTRs equal bilaterally. Psych: Normal affect, dress and demeanor. Normal speech. Normal thought content and judgment.   Assessment/plan: [name] [name] is a 45 y.o. male present for establishment of care/preventative visit with recent urgent care visit. Atypical chest pain - Chest pain does not sound cardiac in nature, patient does have a history of GERD. Will complete CBC, serum met, lipid panel, TSH and H. pylori. Discussed risk stratification any emergent cardiac symptoms. - CBC w/Diff; Future - Comp Met (CMET); Future - Lipid panel; Future - H. pylori antibody, IgG; Future  Nephrolithiasis - 2 attacks, recommended urological referral after last ED visit patient did not follow-up with urology. We'll likely need to establish for kidney stones and follow-up. Patient would like referral today. - CBC w/Diff; Future - Comp Met (CMET); Future - Ambulatory referral to Urology  Encounter for preventive health examination Encourage greater than 150 minutes of exercise weekly. Diet full of fresh fruits and vegetables, lean meats, low saturated fats. - Daily baby aspirin Screening for deficiency anemia - CBC w/Diff; Future Prostate cancer screening - PSA; Future Encounter for screening for HIV - HIV antibody (with reflex); Future Thyroid disorder screen - TSH; Future Encounter for lipid screening for cardiovascular disease - Lipid panel; Future Need for diphtheria-tetanus-pertussis (Tdap) vaccine - Tdap vaccine greater than or equal to 7yo IM  Body mass index (BMI) of 36.0 to 36.9 (HCC) - Lipid panel; Future - HgB A1c; Future - TSH: Future   Return in about 1 year (around 06/25/2017) for CPE. Sooner if labs indicate need.  Electronically signed by: Howard Pouch, DO [place]

## 2016-06-25 NOTE — Patient Instructions (Signed)
We will complete fasting labs for you, I have placed these labs and you need to make an appt fasting (no food 9 hours, water only). This is looking to see if any risk we need to address.   Your heart discomfort, could have been reflux related, but you need to watch for any signs of heart disease. I have included this below in instructions. Depending on lab work results and any further signs of heart disease, we may decide to send you to cardiology for evaluation.   Tdap given today.   I have referred to urologist for evaluation on kidney stones.   Health Maintenance, Male A healthy lifestyle and preventative care can promote health and wellness.  Maintain regular health, dental, and eye exams.  Eat a healthy diet. Foods like vegetables, fruits, whole grains, low-fat dairy products, and lean protein foods contain the nutrients you need and are low in calories. Decrease your intake of foods high in solid fats, added sugars, and salt. Get information about a proper diet from your health care provider, if necessary.  Regular physical exercise is one of the most important things you can do for your health. Most adults should get at least 150 minutes of moderate-intensity exercise (any activity that increases your heart rate and causes you to sweat) each week. In addition, most adults need muscle-strengthening exercises on 2 or more days a week.   Maintain a healthy weight. The body mass index (BMI) is a screening tool to identify possible weight problems. It provides an estimate of body fat based on height and weight. Your health care provider can find your BMI and can help you achieve or maintain a healthy weight. For males 20 years and older:  A BMI below 18.5 is considered underweight.  A BMI of 18.5 to 24.9 is normal.  A BMI of 25 to 29.9 is considered overweight.  A BMI of 30 and above is considered obese.  Maintain normal blood lipids and cholesterol by exercising and minimizing your  intake of saturated fat. Eat a balanced diet with plenty of fruits and vegetables. Blood tests for lipids and cholesterol should begin at age 71 and be repeated every 5 years. If your lipid or cholesterol levels are high, you are over age 48, or you are at high risk for heart disease, you may need your cholesterol levels checked more frequently.Ongoing high lipid and cholesterol levels should be treated with medicines if diet and exercise are not working.  If you smoke, find out from your health care provider how to quit. If you do not use tobacco, do not start.  Lung cancer screening is recommended for adults aged 45-80 years who are at high risk for developing lung cancer because of a history of smoking. A yearly low-dose CT scan of the lungs is recommended for people who have at least a 30-pack-year history of smoking and are current smokers or have quit within the past 15 years. A pack year of smoking is smoking an average of 1 pack of cigarettes a day for 1 year (for example, a 30-pack-year history of smoking could mean smoking 1 pack a day for 30 years or 2 packs a day for 15 years). Yearly screening should continue until the smoker has stopped smoking for at least 15 years. Yearly screening should be stopped for people who develop a health problem that would prevent them from having lung cancer treatment.  If you choose to drink alcohol, do not have more than 2 drinks  per day. One drink is considered to be 12 oz (360 mL) of beer, 5 oz (150 mL) of wine, or 1.5 oz (45 mL) of liquor.  Avoid the use of street drugs. Do not share needles with anyone. Ask for help if you need support or instructions about stopping the use of drugs.  High blood pressure causes heart disease and increases the risk of stroke. High blood pressure is more likely to develop in:  People who have blood pressure in the end of the normal range (100-139/85-89 mm Hg).  People who are overweight or obese.  People who are  African American.  If you are 22-59 years of age, have your blood pressure checked every 3-5 years. If you are 39 years of age or older, have your blood pressure checked every year. You should have your blood pressure measured twice--once when you are at a hospital or clinic, and once when you are not at a hospital or clinic. Record the average of the two measurements. To check your blood pressure when you are not at a hospital or clinic, you can use:  An automated blood pressure machine at a pharmacy.  A home blood pressure monitor.  If you are 53-18 years old, ask your health care provider if you should take aspirin to prevent heart disease.  Diabetes screening involves taking a blood sample to check your fasting blood sugar level. This should be done once every 3 years after age 28 if you are at a normal weight and without risk factors for diabetes. Testing should be considered at a younger age or be carried out more frequently if you are overweight and have at least 1 risk factor for diabetes.  Colorectal cancer can be detected and often prevented. Most routine colorectal cancer screening begins at the age of 6 and continues through age 39. However, your health care provider may recommend screening at an earlier age if you have risk factors for colon cancer. On a yearly basis, your health care provider may provide home test kits to check for hidden blood in the stool. A small camera at the end of a tube may be used to directly examine the colon (sigmoidoscopy or colonoscopy) to detect the earliest forms of colorectal cancer. Talk to your health care provider about this at age 38 when routine screening begins. A direct exam of the colon should be repeated every 5-10 years through age 50, unless early forms of precancerous polyps or small growths are found.  People who are at an increased risk for hepatitis B should be screened for this virus. You are considered at high risk for hepatitis B  if:  You were born in a country where hepatitis B occurs often. Talk with your health care provider about which countries are considered high risk.  Your parents were born in a high-risk country and you have not received a shot to protect against hepatitis B (hepatitis B vaccine).  You have HIV or AIDS.  You use needles to inject street drugs.  You live with, or have sex with, someone who has hepatitis B.  You are a man who has sex with other men (MSM).  You get hemodialysis treatment.  You take certain medicines for conditions like cancer, organ transplantation, and autoimmune conditions.  Hepatitis C blood testing is recommended for all people born from [birth year] through 1967 and any individual with known risk factors for hepatitis C.  Healthy men should no longer receive prostate-specific antigen (PSA) blood tests as  part of routine cancer screening. Talk to your health care provider about prostate cancer screening.  Testicular cancer screening is not recommended for adolescents or adult males who have no symptoms. Screening includes self-exam, a health care provider exam, and other screening tests. Consult with your health care provider about any symptoms you have or any concerns you have about testicular cancer.  Practice safe sex. Use condoms and avoid high-risk sexual practices to reduce the spread of sexually transmitted infections (STIs).  You should be screened for STIs, including gonorrhea and chlamydia if:  You are sexually active and are younger than 24 years.  You are older than 24 years, and your health care provider tells you that you are at risk for this type of infection.  Your sexual activity has changed since you were last screened, and you are at an increased risk for chlamydia or gonorrhea. Ask your health care provider if you are at risk.  If you are at risk of being infected with HIV, it is recommended that you take a prescription medicine daily to prevent HIV  infection. This is called pre-exposure prophylaxis (PrEP). You are considered at risk if:  You are a man who has sex with other men (MSM).  You are a heterosexual man who is sexually active with multiple partners.  You take drugs by injection.  You are sexually active with a partner who has HIV.  Talk with your health care provider about whether you are at high risk of being infected with HIV. If you choose to begin PrEP, you should first be tested for HIV. You should then be tested every 3 months for as long as you are taking PrEP.  Use sunscreen. Apply sunscreen liberally and repeatedly throughout the day. You should seek shade when your shadow is shorter than you. Protect yourself by wearing long sleeves, pants, a wide-brimmed hat, and sunglasses year round whenever you are outdoors.  Tell your health care provider of new moles or changes in moles, especially if there is a change in shape or color. Also, tell your health care provider if a mole is larger than the size of a pencil eraser.  A one-time screening for abdominal aortic aneurysm (AAA) and surgical repair of large AAAs by ultrasound is recommended for men aged 88-75 years who are current or former smokers.  Stay current with your vaccines (immunizations).   This information is not intended to replace advice given to you by your health care provider. Make sure you discuss any questions you have with your health care provider.   Document Released: 04/12/2008 Document Revised: 11/05/2014 Document Reviewed: 03/12/2011 Elsevier Interactive Patient Education 2016 [place].    Angina Pectoris Angina pectoris, often called angina, is extreme discomfort in the chest, neck, or arm. This is caused by a lack of blood in the middle and thickest layer of the heart wall (myocardium). There are four types of angina:  Stable angina. Stable angina usually occurs in episodes of predictable frequency and duration. It is usually brought on  by physical activity, stress, or excitement. Stable angina usually lasts a few minutes and can often be relieved by a medicine that you place under your tongue. This medicine is called sublingual nitroglycerin.  Unstable angina. Unstable angina can occur even when you are doing little or no physical activity. It can even occur while you are sleeping or when you are at rest. It can suddenly increase in severity or frequency. It may not be relieved by sublingual nitroglycerin,  and it can last up to 30 minutes.  Microvascular angina. This type of angina is caused by a disorder of tiny blood vessels called arterioles. Microvascular angina is more common in women. The pain may be more severe and last longer than other types of angina pectoris.  Prinzmetal or variant angina. This type of angina pectoris is rare and usually occurs when you are doing little or no physical activity. It especially occurs in the early morning hours. CAUSES Atherosclerosis is the cause of angina. This is the buildup of fat and cholesterol (plaque) on the inside of the arteries. Over time, the plaque may narrow or block the artery, and this will lessen blood flow to the heart. Plaque can also become weak and break off within a coronary artery to form a clot and cause a sudden blockage. RISK FACTORS Risk factors common to both men and women include:  High cholesterol levels.  High blood pressure (hypertension).  Tobacco use.  Diabetes.  Family history of angina.  Obesity.  Lack of exercise.  A diet high in saturated fats. Women are at greater risk for angina if they are:  Over age 82.  Postmenopausal. SYMPTOMS Many people do not experience any symptoms during the early stages of angina. As the condition progresses, symptoms common to both men and women may include:  Chest pain.  The pain can be described as a crushing or squeezing in the chest, or a tightness, pressure, fullness, or heaviness in the  chest.  The pain can last more than a few minutes, or it can stop and recur.  Pain in the arms, neck, jaw, or back.  Unexplained heartburn or indigestion.  Shortness of breath.  Nausea.  Sudden cold sweats.  Sudden light-headedness. Many women have chest discomfort and some of the other symptoms. However, women often have different (atypical) symptoms, such as:   Fatigue.  Unexplained feelings of nervousness or anxiety.  Unexplained weakness.  Dizziness or fainting. Sometimes, women may have angina without any symptoms. DIAGNOSIS  Tests to diagnose angina may include:  ECG (electrocardiogram).  Exercise stress test. This looks for signs of blockage when the heart is being exercised.  Pharmacologic stress test. This test looks for signs of blockage when the heart is being stressed with a medicine.  Blood tests.  Coronary angiogram. This is a procedure to look at the coronary arteries to see if there is any blockage. TREATMENT  The treatment of angina may include the following:  Healthy behavioral changes to reduce or control risk factors.  Medicine.  Coronary stenting.A stent helps to keep an artery open.  Coronary angioplasty. This procedure widens a narrowed or blocked artery.  Coronary arterybypass surgery. This will allow your blood to pass the blockage (bypass) to reach your heart. HOME CARE INSTRUCTIONS   Take medicines only as directed by your health care provider.  Do not take the following medicines unless your health care provider approves:  Nonsteroidal anti-inflammatory drugs (NSAIDs), such as ibuprofen, naproxen, or celecoxib.  Vitamin supplements that contain vitamin A, vitamin E, or both.  Hormone replacement therapy that contains estrogen with or without progestin.  Manage other health conditions such as hypertension and diabetes as directed by your health care provider.  Follow a heart-healthy diet. A dietitian can help to educate you  about healthy food options and changes.  Use healthy cooking methods such as roasting, grilling, broiling, baking, poaching, steaming, or stir-frying. Talk to a dietitian to learn more about healthy cooking methods.  Follow  an exercise program approved by your health care provider.  Maintain a healthy weight. Lose weight as approved by your health care provider.  Plan rest periods when fatigued.  Learn to manage stress.  Do not use any tobacco products, including cigarettes, chewing tobacco, or electronic cigarettes. If you need help quitting, ask your health care provider.  If you drink alcohol, and your health care provider approves, limit your alcohol intake to no more than 1 drink per day. One drink equals 12 ounces of beer, 5 ounces of wine, or 1 ounces of hard liquor.  Stop illegal drug use.  Keep all follow-up visits as directed by your health care provider. This is important. SEEK IMMEDIATE MEDICAL CARE IF:   You have pain in your chest, neck, arm, jaw, stomach, or back that lasts more than a few minutes, is recurring, or is unrelieved by taking sublingualnitroglycerin.  You have profuse sweating without cause.  You have unexplained:  Heartburn or indigestion.  Shortness of breath or difficulty breathing.  Nausea or vomiting.  Fatigue.  Feelings of nervousness or anxiety.  Weakness.  Diarrhea.  You have sudden light-headedness or dizziness.  You faint. These symptoms may represent a serious problem that is an emergency. Do not wait to see if the symptoms will go away. Get medical help right away. Call your local emergency services (911 in the [place] Do not drive yourself to the hospital.   This information is not intended to replace advice given to you by your health care provider. Make sure you discuss any questions you have with your health care provider.   Document Released: 10/15/2005 Document Revised: 11/05/2014 Document Reviewed: 02/16/2014 Elsevier  Interactive Patient Education [employer].

## 2016-06-27 ENCOUNTER — Encounter: Payer: Self-pay | Admitting: Family Medicine

## 2016-07-03 ENCOUNTER — Other Ambulatory Visit (INDEPENDENT_AMBULATORY_CARE_PROVIDER_SITE_OTHER): Payer: 59

## 2016-07-03 DIAGNOSIS — Z13220 Encounter for screening for lipoid disorders: Secondary | ICD-10-CM | POA: Diagnosis not present

## 2016-07-03 DIAGNOSIS — Z125 Encounter for screening for malignant neoplasm of prostate: Secondary | ICD-10-CM

## 2016-07-03 DIAGNOSIS — R0789 Other chest pain: Secondary | ICD-10-CM | POA: Diagnosis not present

## 2016-07-03 DIAGNOSIS — Z114 Encounter for screening for human immunodeficiency virus [HIV]: Secondary | ICD-10-CM | POA: Diagnosis not present

## 2016-07-03 DIAGNOSIS — Z6836 Body mass index (BMI) 36.0-36.9, adult: Secondary | ICD-10-CM | POA: Diagnosis not present

## 2016-07-03 DIAGNOSIS — Z130 Encounter for screening for diseases of the blood and blood-forming organs and certain disorders involving the immune mechanism: Secondary | ICD-10-CM | POA: Diagnosis not present

## 2016-07-03 DIAGNOSIS — Z136 Encounter for screening for cardiovascular disorders: Secondary | ICD-10-CM | POA: Diagnosis not present

## 2016-07-03 DIAGNOSIS — N200 Calculus of kidney: Secondary | ICD-10-CM | POA: Diagnosis not present

## 2016-07-03 DIAGNOSIS — Z1329 Encounter for screening for other suspected endocrine disorder: Secondary | ICD-10-CM | POA: Diagnosis not present

## 2016-07-03 LAB — COMPREHENSIVE METABOLIC PANEL
ALT: 64 U/L — ABNORMAL HIGH (ref 0–53)
AST: 38 U/L — ABNORMAL HIGH (ref 0–37)
Albumin: 4.3 g/dL (ref 3.5–5.2)
Alkaline Phosphatase: 79 U/L (ref 39–117)
BUN: 13 mg/dL (ref 6–23)
CO2: 28 mEq/L (ref 19–32)
Calcium: 9 mg/dL (ref 8.4–10.5)
Chloride: 105 mEq/L (ref 96–112)
Creatinine, Ser: 0.98 mg/dL (ref 0.40–1.50)
GFR: 87.09 mL/min (ref >60.00)
Glucose, Bld: 102 mg/dL — ABNORMAL HIGH (ref 70–99)
Potassium: 4 mEq/L (ref 3.5–5.1)
Sodium: 139 mEq/L (ref 135–145)
Total Bilirubin: 1 mg/dL (ref 0.2–1.2)
Total Protein: 7.1 g/dL (ref 6.0–8.3)

## 2016-07-03 LAB — LIPID PANEL
Cholesterol: 194 mg/dL (ref 0–200)
HDL: 40 mg/dL (ref >39.00)
LDL Cholesterol: 117 mg/dL — ABNORMAL HIGH (ref 0–99)
NonHDL: 154.39
Total CHOL/HDL Ratio: 5
Triglycerides: 189 mg/dL — ABNORMAL HIGH (ref 0.0–149.0)
VLDL: 37.8 mg/dL (ref 0.0–40.0)

## 2016-07-03 LAB — H. PYLORI ANTIBODY, IGG: H Pylori IgG: POSITIVE — AB

## 2016-07-03 LAB — CBC WITH DIFFERENTIAL/PLATELET
Basophils Absolute: 0 10*3/uL (ref 0.0–0.1)
Basophils Relative: 0.6 % (ref 0.0–3.0)
Eosinophils Absolute: 0.8 10*3/uL — ABNORMAL HIGH (ref 0.0–0.7)
Eosinophils Relative: 12.1 % — ABNORMAL HIGH (ref 0.0–5.0)
HCT: 42.8 % (ref 39.0–52.0)
Hemoglobin: 14.9 g/dL (ref 13.0–17.0)
Lymphocytes Relative: 35.1 % (ref 12.0–46.0)
Lymphs Abs: 2.3 10*3/uL (ref 0.7–4.0)
MCHC: 34.8 g/dL (ref 30.0–36.0)
MCV: 97 fl (ref 78.0–100.0)
Monocytes Absolute: 0.4 10*3/uL (ref 0.1–1.0)
Monocytes Relative: 5.4 % (ref 3.0–12.0)
Neutro Abs: 3.1 10*3/uL (ref 1.4–7.7)
Neutrophils Relative %: 46.8 % (ref 43.0–77.0)
Platelets: 154 10*3/uL (ref 150.0–400.0)
RBC: 4.42 Mil/uL (ref 4.22–5.81)
RDW: 12.6 % (ref 11.5–15.5)
WBC: 6.6 10*3/uL (ref 4.0–10.5)

## 2016-07-03 LAB — PSA: PSA: 0.6 ng/mL (ref 0.10–4.00)

## 2016-07-03 LAB — TSH: TSH: 1.92 u[IU]/mL (ref 0.35–4.50)

## 2016-07-03 LAB — HEMOGLOBIN A1C: Hgb A1c MFr Bld: 5.6 % (ref 4.6–6.5)

## 2016-07-04 ENCOUNTER — Telehealth: Payer: Self-pay | Admitting: Family Medicine

## 2016-07-04 LAB — HIV ANTIBODY (ROUTINE TESTING W REFLEX): HIV 1&2 Ab, 4th Generation: NONREACTIVE

## 2016-07-04 NOTE — Telephone Encounter (Signed)
Please call pt:  - his labs all look good with the following exceptions: - Elevated liver enzymes and  Triglycerides--> usually this is from cholesterol/fatty liver infiltrate, which he has by prior imaging results in 2012. However, we would not want to miss a possible liver disease, and the elevation is new.     - I would recommned he start fish oil supp. 1-2 g daily. Eat a diet low in saturated fat, higher in fiber. Exercise > 150 minutes a week.      - F/u in 1- 2 month for repeat liver enzymes and hep panel. Provider appt.  - he had "atypical chest pain" and he has had a h/o of reflux/H. Pylori+. If he has any additional discomfort, I would recommend he start medication for GERD for at least 8 weeks (prilosec OTC ok)

## 2016-07-04 NOTE — Telephone Encounter (Signed)
Tried to call patient voice mail not set up yet.

## 2016-07-05 NOTE — Telephone Encounter (Signed)
Spoke with patient reviewed lab results and instructions. Scheduled patient for follow up appt.

## 2016-07-10 ENCOUNTER — Telehealth: Payer: Self-pay | Admitting: Family Medicine

## 2016-07-10 MED ORDER — PANTOPRAZOLE SODIUM 40 MG PO TBEC: 40.0000 mg | DELAYED_RELEASE_TABLET | Freq: Two times a day (BID) | ORAL | 0 refills | Status: DC

## 2016-07-10 MED ORDER — AMOXICILLIN 500 MG PO TABS: 500.0000 mg | ORAL_TABLET | Freq: Two times a day (BID) | ORAL | 0 refills | Status: DC

## 2016-07-10 MED ORDER — CLARITHROMYCIN 500 MG PO TABS: 500.0000 mg | ORAL_TABLET | Freq: Two times a day (BID) | ORAL | 0 refills | Status: DC

## 2016-07-10 NOTE — Telephone Encounter (Signed)
Patient calling to report he has reviewed his lab results on mychart and it showed he tested positive for something.  Patient wants to know if you needs to be prescribed medication for this.  Pt requesting call back asap to discuss.

## 2016-07-10 NOTE — Telephone Encounter (Signed)
Spoke with patient reviewed information and instructions. 

## 2016-07-10 NOTE — Telephone Encounter (Signed)
Patient tested positive for H Pylori Please advise.

## 2016-07-10 NOTE — Telephone Encounter (Signed)
H pylori +. I eRx'd pantoprazole 40mg  twice a day x 1 mo. Clarithromycin 500 mg twice a day for 2 wks. Amoxicillin 500 mg bid x 14d. F/u with Dr. Raoul Pitch 1 mo.-thx

## 2016-07-17 ENCOUNTER — Encounter: Payer: Self-pay | Admitting: Family Medicine

## 2016-07-24 ENCOUNTER — Other Ambulatory Visit: Payer: Self-pay | Admitting: *Deleted

## 2016-07-24 MED ORDER — PANTOPRAZOLE SODIUM 40 MG PO TBEC
40.0000 mg | DELAYED_RELEASE_TABLET | Freq: Two times a day (BID) | ORAL | 0 refills | Status: DC
Start: 2016-07-24 — End: 2016-09-17

## 2016-07-24 MED FILL — PANTOPRAZOLE SOD DR 40 MG T: 40 | 30 days supply | Qty: 60 | Fill #0

## 2016-08-20 DIAGNOSIS — N200 Calculus of kidney: Secondary | ICD-10-CM | POA: Diagnosis not present

## 2016-09-17 ENCOUNTER — Encounter: Payer: Self-pay | Admitting: Family Medicine

## 2016-09-17 ENCOUNTER — Encounter: Payer: Self-pay | Admitting: Physician Assistant

## 2016-09-17 ENCOUNTER — Telehealth: Payer: Self-pay | Admitting: Family Medicine

## 2016-09-17 ENCOUNTER — Ambulatory Visit (INDEPENDENT_AMBULATORY_CARE_PROVIDER_SITE_OTHER): Payer: 59 | Admitting: Family Medicine

## 2016-09-17 VITALS — BP 130/86 | HR 66 | Temp 98.6°F | Resp 20 | Wt 231.0 lb

## 2016-09-17 DIAGNOSIS — K219 Gastro-esophageal reflux disease without esophagitis: Secondary | ICD-10-CM | POA: Diagnosis not present

## 2016-09-17 DIAGNOSIS — K760 Fatty (change of) liver, not elsewhere classified: Secondary | ICD-10-CM | POA: Diagnosis not present

## 2016-09-17 DIAGNOSIS — R1032 Left lower quadrant pain: Secondary | ICD-10-CM

## 2016-09-17 DIAGNOSIS — R748 Abnormal levels of other serum enzymes: Secondary | ICD-10-CM

## 2016-09-17 LAB — COMPREHENSIVE METABOLIC PANEL
ALT: 74 U/L — ABNORMAL HIGH (ref 0–53)
AST: 42 U/L — ABNORMAL HIGH (ref 0–37)
Albumin: 4.4 g/dL (ref 3.5–5.2)
Alkaline Phosphatase: 81 U/L (ref 39–117)
BUN: 12 mg/dL (ref 6–23)
CO2: 29 mEq/L (ref 19–32)
Calcium: 9.8 mg/dL (ref 8.4–10.5)
Chloride: 105 mEq/L (ref 96–112)
Creatinine, Ser: 0.91 mg/dL (ref 0.40–1.50)
GFR: 94.78 mL/min (ref >60.00)
Glucose, Bld: 90 mg/dL (ref 70–99)
Potassium: 4.2 mEq/L (ref 3.5–5.1)
Sodium: 140 mEq/L (ref 135–145)
Total Bilirubin: 0.8 mg/dL (ref 0.2–1.2)
Total Protein: 7.2 g/dL (ref 6.0–8.3)

## 2016-09-17 MED ORDER — PANTOPRAZOLE SODIUM 40 MG PO TBEC: 40.0000 mg | DELAYED_RELEASE_TABLET | Freq: Two times a day (BID) | ORAL | 1 refills | Status: DC

## 2016-09-17 MED FILL — PANTOPRAZOLE SOD DR 40 MG T: 40 | 30 days supply | Qty: 60 | Fill #0

## 2016-09-17 NOTE — Telephone Encounter (Signed)
Patient's english is limited & he is hard to reach. Patient requests that we call his wife to give lab results.

## 2016-09-17 NOTE — Progress Notes (Signed)
Patient ID: [name] [name], male  DOB: 06/03/1971, 45 y.o.   MRN: [id number] Patient Care Team    Relationship Specialty Notifications Start End  Ma Hillock, DO PCP - General Family Medicine  06/25/16     Subjective:  [name] [name] is a 45 y.o.  male present for follow up on elevated LFT.  Patient presents for follow up on GERD, abdominal pain and elevated LFT. He presents today with his wife who provides the majority of history. On establishment appt pt was found to have elevated LFT, with a known h/o of hepatic steatosis since 2012, but never elevated LFT. He has started the fish oil supplement. He is not fasting today. He also completed the H.Pylori treatment and continues the PPI. He states he does not feel it has helped him much and he still has LLQ pain and stomach pain. He has a normal EGD and colonoscopy in 2006. He denies cardiac chest pain, shortness of breath, weight loss, lower extremity edema.   Depression screen PHQ 2/9 06/25/2016  Decreased Interest 0  Down, Depressed, Hopeless 0  PHQ - 2 Score 0    Immunization History  Administered Date(s) Administered  . Influenza-Unspecified 07/30/2015, 08/04/2016  . Tdap 06/25/2016    Past Medical History:  Diagnosis Date  . GERD (gastroesophageal reflux disease)   . IBS (irritable bowel syndrome)   . Kidney stone    No Known Allergies Past Surgical History:  Procedure Laterality Date  . NO PAST SURGERIES     Family History  Problem Relation Age of Onset  . Cirrhosis Paternal Grandfather    Social History   Social History  . Marital status: Married    Spouse name: N/A  . Number of children: N/A  . Years of education: N/A   Occupational History  . Not on file.   Social History Main Topics  . Smoking status: Never Smoker  . Smokeless tobacco: Never Used  . Alcohol use 0.0 oz/week     Comment: occ  . Drug use: No  . Sexual activity: Yes    Partners: Female    Birth control/ protection: None      Comment: Married   Other Topics Concern  . Not on file   Social History Narrative   Married to [name] [name]. Has 3 children [name] [name] and [name] [name] school education. Works as a Theme park manager.   Drinks caffeine, uses herbal remedies.   Wears his seatbelt, smoke detectors in the house.   No firearms in the house.   Feels safe in his relationships.     Medication List       Accurate as of 09/17/16  8:09 AM. Always use your most recent med list.          Fish Oil 1000 MG Caps Take by mouth.   lansoprazole 15 MG capsule Commonly known as:  PREVACID Take 15 mg by mouth daily at 12 noon.        Recent Results (from the past 2160 hour(s))  CBC w/Diff     Status: Abnormal   Collection Time: 07/03/16  8:04 AM  Result Value Ref Range   WBC 6.6 4.0 - 10.5 K/uL   RBC 4.42 4.22 - 5.81 Mil/uL   Hemoglobin 14.9 13.0 - 17.0 g/dL   HCT 42.8 39.0 - 52.0 %   MCV 97.0 78.0 - 100.0 fl   MCHC 34.8 30.0 - 36.0 g/dL   RDW 12.6 11.5 - 15.5 %  Platelets 154.0 150.0 - 400.0 K/uL   Neutrophils Relative % 46.8 43.0 - 77.0 %   Lymphocytes Relative 35.1 12.0 - 46.0 %   Monocytes Relative 5.4 3.0 - 12.0 %   Eosinophils Relative 12.1 (H) 0.0 - 5.0 %   Basophils Relative 0.6 0.0 - 3.0 %   Neutro Abs 3.1 1.4 - 7.7 K/uL   Lymphs Abs 2.3 0.7 - 4.0 K/uL   Monocytes Absolute 0.4 0.1 - 1.0 K/uL   Eosinophils Absolute 0.8 (H) 0.0 - 0.7 K/uL   Basophils Absolute 0.0 0.0 - 0.1 K/uL  Comp Met (CMET)     Status: Abnormal   Collection Time: 07/03/16  8:04 AM  Result Value Ref Range   Sodium 139 135 - 145 mEq/L   Potassium 4.0 3.5 - 5.1 mEq/L   Chloride 105 96 - 112 mEq/L   CO2 28 19 - 32 mEq/L   Glucose, Bld 102 (H) 70 - 99 mg/dL   BUN 13 6 - 23 mg/dL   Creatinine, Ser 0.98 0.40 - 1.50 mg/dL   Total Bilirubin 1.0 0.2 - 1.2 mg/dL   Alkaline Phosphatase 79 39 - 117 U/L   AST 38 (H) 0 - 37 U/L   ALT 64 (H) 0 - 53 U/L   Total Protein 7.1 6.0 - 8.3 g/dL   Albumin 4.3 3.5 - 5.2 g/dL   Calcium  9.0 8.4 - 10.5 mg/dL   GFR 87.09 >60.00 mL/min  Lipid panel     Status: Abnormal   Collection Time: 07/03/16  8:04 AM  Result Value Ref Range   Cholesterol 194 0 - 200 mg/dL    Comment: ATP III Classification       Desirable:  < 200 mg/dL               Borderline High:  200 - 239 mg/dL          High:  > = 240 mg/dL   Triglycerides 189.0 (H) 0.0 - 149.0 mg/dL    Comment: Normal:  <150 mg/dLBorderline High:  150 - 199 mg/dL   HDL 40.00 >39.00 mg/dL   VLDL 37.8 0.0 - 40.0 mg/dL   LDL Cholesterol 117 (H) 0 - 99 mg/dL   Total CHOL/HDL Ratio 5     Comment:                Men          Women1/2 Average Risk     3.4          3.3Average Risk          5.0          4.42X Average Risk          9.6          7.13X Average Risk          15.0          11.0                       NonHDL 154.39     Comment: NOTE:  Non-HDL goal should be 30 mg/dL higher than patient's LDL goal (i.e. LDL goal of < 70 mg/dL, would have non-HDL goal of < 100 mg/dL)  PSA     Status: None   Collection Time: 07/03/16  8:04 AM  Result Value Ref Range   PSA 0.60 0.10 - 4.00 ng/mL  HIV antibody (with reflex)     Status: None   Collection Time:  07/03/16  8:04 AM  Result Value Ref Range   HIV 1&2 Ab, 4th Generation NONREACTIVE NONREACTIVE    Comment:   HIV-1 antigen and HIV-1/HIV-2 antibodies were not detected.  There is no laboratory evidence of HIV infection.   HIV-1/2 Antibody Diff        Not indicated. HIV-1 RNA, Qual TMA          Not indicated.     PLEASE NOTE: This information has been disclosed to you from records whose confidentiality may be protected by state law. If your state requires such protection, then the state law prohibits you from making any further disclosure of the information without the specific written consent of the person to whom it pertains, or as otherwise permitted by law. A general authorization for the release of medical or other information is NOT sufficient for this purpose.   The performance  of this assay has not been clinically validated in patients less than 54 years old.   For additional information please refer to http://education.[facility].com/faq/FAQ106.  (This link is being provided for informational/educational purposes only.)     TSH     Status: None   Collection Time: 07/03/16  8:04 AM  Result Value Ref Range   TSH 1.92 0.35 - 4.50 uIU/mL  H. pylori antibody, IgG     Status: Abnormal   Collection Time: 07/03/16  8:04 AM  Result Value Ref Range   H Pylori IgG Positive (A) Negative  HgB A1c     Status: None   Collection Time: 07/03/16  8:04 AM  Result Value Ref Range   Hgb A1c MFr Bld 5.6 4.6 - 6.5 %    Comment: Glycemic Control Guidelines for People with Diabetes:Non Diabetic:  <6%Goal of Therapy: <7%Additional Action Suggested:  >8%     US Renal  Result Date: 05/09/2016 CLINICAL DATA:  Left flank pain radiating to the left pelvis EXAM: RENAL / URINARY TRACT ULTRASOUND COMPLETE COMPARISON:  None. FINDINGS: Right Kidney: Length: 12.4 cm. Echogenicity within normal limits. 6 mm calculus in the interpolar aspect of the right kidney. No mass or hydronephrosis visualized. Left Kidney: Length: 12.1 cm. Echogenicity within normal limits. Multiple echogenic foci in the left kidney consistent with nephrolithiasis. No mass or hydronephrosis visualized. Bladder: Appears normal for degree of bladder distention. IMPRESSION: 1. Bilateral nonobstructing renal calculi. Electronically Signed   By: Kathreen Devoid   On: 05/09/2016 21:00   ROS: 14 pt review of systems performed and negative (unless mentioned in an HPI)  Objective: BP 130/86 (BP Location: Right Arm, Patient Position: Sitting, Cuff Size: Normal)   Pulse 66   Temp 98.6 F (37 C) (Oral)   Resp 20   Wt 231 lb (104.8 kg)   SpO2 98%   BMI 36.18 kg/m  Gen: Afebrile. No acute distress. Nontoxic in appearance, well-developed, well-nourished,  obese male HENT: AT. [place].MMM. No cough. Eyes:Pupils Equal Round Reactive  to light, Extraocular movements intact,  Conjunctiva without redness, discharge or icterus. CV: RRR no murmur appreciated, no edema, +2/4 P posterior tibialis pulses. No carotid bruits. No JVD. Chest: CTAB, no wheeze, rhonchi or crackles. Normal Respiratory effort. Good Air movement. Abd: Soft. Obese. NTND. BS present. No Masses palpated. No hepatosplenomegaly. No rebound tenderness or guarding. Neuro/Msk:  Normal gait. PERLA. EOMi. Alert. Oriented x3.    Assessment/plan: [name] [name] is a 45 y.o. male present for elevated LFT Abnormal liver enzymes/H/O hepatic steatosis.  - discussed diet and exercise modifications. Continue fish oil supplement. Avoidance of  alcohol.  - Comp Met (CMET) - Hepatitis panel, acute - Ambulatory referral to Gastroenterology  Gastroesophageal reflux disease without esophagitis/ LLQ pain - H. Pylori positive. Symptoms remain despite treatment. Refer to GI, may need EGD. Prior [facility] pt in 2006.  - Ambulatory referral to Gastroenterology   F/U yearly for CPE, sooner if labs indicate need.   Electronically signed by: Howard Pouch, DO [place]

## 2016-09-17 NOTE — Patient Instructions (Addendum)
I will call you with the lab results once available.  I will also place a referral to GI to investigate your abdominal discomfort.   Continue the Protonix two times a day, until you see GI.

## 2016-09-17 NOTE — Telephone Encounter (Signed)
noted 

## 2016-09-18 ENCOUNTER — Telehealth: Payer: Self-pay | Admitting: Family Medicine

## 2016-09-18 DIAGNOSIS — R748 Abnormal levels of other serum enzymes: Secondary | ICD-10-CM

## 2016-09-18 DIAGNOSIS — K760 Fatty (change of) liver, not elsewhere classified: Secondary | ICD-10-CM

## 2016-09-18 DIAGNOSIS — K219 Gastro-esophageal reflux disease without esophagitis: Secondary | ICD-10-CM

## 2016-09-18 DIAGNOSIS — R1032 Left lower quadrant pain: Secondary | ICD-10-CM

## 2016-09-18 LAB — HEPATITIS PANEL, ACUTE
HCV Ab: NEGATIVE
Hep A IgM: NONREACTIVE
Hep B C IgM: NONREACTIVE
Hepatitis B Surface Ag: NEGATIVE

## 2016-09-18 NOTE — Telephone Encounter (Signed)
-   please call pt or wife.  - his liver enzymes are mildly higher than last check. His Hep panle is negative.  - I have ordered an [place] of his abd, this is focus on the liver, but will also for his chronic abd  Pain.

## 2016-09-18 NOTE — Telephone Encounter (Signed)
Patient notified with message and called and left message on voice mail for wife.

## 2016-09-22 ENCOUNTER — Ambulatory Visit (HOSPITAL_BASED_OUTPATIENT_CLINIC_OR_DEPARTMENT_OTHER)
Admission: RE | Admit: 2016-09-22 | Discharge: 2016-09-22 | Disposition: A | Discharge status: Home / Self Care | Payer: 59 | Source: Ambulatory Visit | Attending: Family Medicine | Admitting: Family Medicine

## 2016-09-22 DIAGNOSIS — R748 Abnormal levels of other serum enzymes: Secondary | ICD-10-CM | POA: Diagnosis present

## 2016-09-22 DIAGNOSIS — N200 Calculus of kidney: Secondary | ICD-10-CM | POA: Insufficient documentation

## 2016-09-22 DIAGNOSIS — K8020 Calculus of gallbladder without cholecystitis without obstruction: Secondary | ICD-10-CM | POA: Diagnosis not present

## 2016-09-22 DIAGNOSIS — K219 Gastro-esophageal reflux disease without esophagitis: Secondary | ICD-10-CM | POA: Insufficient documentation

## 2016-09-22 DIAGNOSIS — R1012 Left upper quadrant pain: Secondary | ICD-10-CM | POA: Diagnosis not present

## 2016-09-22 DIAGNOSIS — R1032 Left lower quadrant pain: Secondary | ICD-10-CM | POA: Diagnosis not present

## 2016-09-22 DIAGNOSIS — K760 Fatty (change of) liver, not elsewhere classified: Secondary | ICD-10-CM | POA: Diagnosis not present

## 2016-09-24 ENCOUNTER — Telehealth: Payer: Self-pay | Admitting: Family Medicine

## 2016-09-24 NOTE — Telephone Encounter (Signed)
Please call pt: - His [place] did verify he has fatty liver disease. He also has "sludge" in his gallbladder. This is a thickened material that can cause abd pain and may be the cause of his discomfort. If his discomfort continues despite the PPI use, I would suggest speaking with a surgeon to discuss GB removal. I will place this referral today, if desired. Please advise.

## 2016-09-24 NOTE — Telephone Encounter (Signed)
Left message on voicemail for wife to return call 

## 2016-09-24 NOTE — Telephone Encounter (Signed)
Spoke to patient's wife, she was notified and verbalized understanding.  She stated that patient has an appointment with GI.

## 2016-10-01 ENCOUNTER — Encounter: Payer: Self-pay | Admitting: Physician Assistant

## 2016-10-01 ENCOUNTER — Ambulatory Visit (INDEPENDENT_AMBULATORY_CARE_PROVIDER_SITE_OTHER): Payer: 59 | Admitting: Physician Assistant

## 2016-10-01 ENCOUNTER — Other Ambulatory Visit (INDEPENDENT_AMBULATORY_CARE_PROVIDER_SITE_OTHER): Payer: 59

## 2016-10-01 VITALS — BP 120/74 | HR 80 | Ht 67.0 in | Wt 231.0 lb

## 2016-10-01 DIAGNOSIS — R7989 Other specified abnormal findings of blood chemistry: Secondary | ICD-10-CM | POA: Diagnosis not present

## 2016-10-01 DIAGNOSIS — R945 Abnormal results of liver function studies: Principal | ICD-10-CM

## 2016-10-01 DIAGNOSIS — K760 Fatty (change of) liver, not elsewhere classified: Secondary | ICD-10-CM

## 2016-10-01 LAB — FERRITIN: Ferritin: 286.3 ng/mL (ref 22.0–322.0)

## 2016-10-01 LAB — PROTIME-INR
INR: 1 ratio (ref 0.8–1.0)
Prothrombin Time: 10.7 s (ref 9.6–13.1)

## 2016-10-01 NOTE — Patient Instructions (Signed)
Please go to the basement level to have your labs drawn.  Low Fat Diet. Gas Prevention diet.  We have given you samples of FD Donald Prose and a coupon.   Follow up with Dr. Henrene Pastor in 6 months.

## 2016-10-01 NOTE — Progress Notes (Signed)
Subjective:    Patient ID: [name] [name], male    DOB: 08-04-1971, 45 y.o.   MRN: [id number]  HPI [name] [name] is a pleasant 45 year old Hispanic male referred today by Kerry Hough D.O for evaluation of abnormal LFTs and GERD. He is also had some intermittent abdominal pain. Patient was last seen here in 2008 by Dr. Sharlett Iles. He had undergone: Anoscopy and EGD in 2006 both of which were normal. And He does have history of H. pylori which had been treated in the past. He recently had H. pylori antibody done which was positive. He was noted to have mildly elevated transaminases in September 2017 and these were repeated in November 2017 with finding of an AST of 38 ALT of 64, cholesterol 194, triglycerides 189 LDL elevated at 117. Acute hepatitis serologies are negative. Upper abdominal ultrasound on 09/22/2016 was positive for gallbladder sludge there is no gallbladder wall thickening or ductal dilation and he has hepatic steatosis noted. Also with bilateral renal calculi, no hydronephrosis. [name] [name] Patient states she was first told about fatty liver in 2010. He does not drink any regular alcohol, there is no family history of liver disease that he is aware of. He has been taking Protonix 40 mg daily for heartburn and indigestion and this had recently been increased to twice a day. Has no complaints of dysphagia or odynophagia. He is a somewhat difficult historian but his only complaint of discomfort is intermittent discomfort on his left side she says has come and gone for couple of years. He also complains of gassiness. His bowel movements have been normal there is no melena or hematochezia. He specifically has no complaints of epigastric or right sided abdominal pain no nausea or vomiting. Appetite has been good ,no dysphagia or odynophagia. He does complain of gassiness after eating.  Review of Systems Pertinent positive and negative review of systems were noted in the above HPI section.  All other  review of systems was otherwise negative.  Outpatient Encounter Prescriptions as of 10/01/2016  Medication Sig  . Omega-3 Fatty Acids (FISH OIL) 1000 MG CAPS Take by mouth.  . pantoprazole (PROTONIX) 40 MG tablet Take 1 tablet (40 mg total) by mouth 2 (two) times daily.   No facility-administered encounter medications on file as of 10/01/2016.    No Known Allergies Patient Active Problem List   Diagnosis Date Noted  . Hepatic steatosis 09/17/2016  . Gastroesophageal reflux disease without esophagitis 09/17/2016  . Abnormal liver enzymes 09/17/2016  . LLQ pain 09/17/2016  . Body mass index (BMI) of 36.0 to 36.9 06/25/2016  . Atypical chest pain 06/25/2016  . Nephrolithiasis 06/25/2016  . Encounter for preventive health examination 06/25/2016   Social History   Social History  . Marital status: Married    Spouse name: N/A  . Number of children: 3  . Years of education: N/A   Occupational History  . roofer    Social History Main Topics  . Smoking status: Never Smoker  . Smokeless tobacco: Never Used  . Alcohol use 0.0 oz/week     Comment: occ  . Drug use: No  . Sexual activity: Yes    Partners: Female    Birth control/ protection: None     Comment: Married   Other Topics Concern  . Not on file   Social History Narrative   Married to [name] [name]. Has 3 children [name] [name] and [name] [name] school education. Works as a Theme park manager.   Drinks caffeine, uses herbal remedies.  Wears his seatbelt, smoke detectors in the house.   No firearms in the house.   Feels safe in his relationships.    Mr. Stachowski family history includes Cirrhosis in his paternal grandfather.      Objective:    Vitals:   10/01/16 0855  BP: 120/74  Pulse: 80    Physical Exam  well-developed Hispanic male in no acute distress, blood pressure 120/74 pulse 80, Height 5 foot 7, weight 231 BMI 36.1. Patient accompanied by his wife. HEENT ;nontraumatic normocephalic EOMI PERRLA sclera  anicteric, Cardiovascular ;regular rate and rhythm with S1-S2 no murmur or gallop, Pulmonary; clear bilaterally, Abdomen ;soft nontender nondistended there is no palpable mass or hepatosplenomegaly bowel sounds are present, Rectal ;exam not done, Ext; no clubbing cyanosis or edema skin warm and dry,#1  Neuropsych; mood and affect appropriate       Assessment & Plan:   #60 45 year old Hispanic male with what sounds like chronic GERD, stable on Protonix. Negative EGD 2006 #2 mild transaminitis-likely secondary to fatty liver disease/NASH #3 gallbladder sludge #4  intermittent left mid left lower quadrant pain-Asian has history of ureteral lithiasis and suspect his  Intermittent left-sided pain may be secondary   Plan; continue Protonix 40 mg by mouth twice a day short-term, would hope to decrease to once daily in a couple of months. Will check ferritin, ProTime/INR, ceruloplasmin, alpha-1 antitrypsin level, ANA, and AMA. Start low-fat low-cholesterol diet and also advised focus on weight loss to normalize BMI as primary therapy for fatty liver disease. Also discussed avoidance of alcohol, again patient does not drink on any sort of regular basis Low gas diet  add FD Gard ,2 by mouth twice a day for gas and bloating  Follow-up with a meal or Dr. Henrene Pastor  in 4-6 months.   Wyeth Hoffer S Esmeralda Malay PA-C 10/01/2016   Cc: Howard Pouch A, DO

## 2016-10-01 NOTE — Progress Notes (Signed)
Agree with initial assessment and plans. Nonsense words recognized. The patient should have his next follow-up with Amy

## 2016-10-02 LAB — ANA: Anti Nuclear Antibody(ANA): NEGATIVE

## 2016-10-03 LAB — ANTI-SMOOTH MUSCLE ANTIBODY, IGG: Smooth Muscle Ab: <20 U (ref <20)

## 2016-10-03 LAB — ALPHA-1-ANTITRYPSIN: A-1 Antitrypsin, Ser: 117 mg/dL (ref 83–199)

## 2016-10-03 LAB — CERULOPLASMIN: Ceruloplasmin: 22 mg/dL (ref 18–36)

## 2016-10-03 LAB — MITOCHONDRIAL ANTIBODIES: Mitochondrial M2 Ab, IgG: <=20 Units (ref <=20.0)

## 2016-12-24 MED FILL — PANTOPRAZOLE SOD DR 40 MG T: 40 | 30 days supply | Qty: 60 | Fill #1

## 2017-01-03 ENCOUNTER — Encounter: Payer: Self-pay | Admitting: Family Medicine

## 2017-01-03 ENCOUNTER — Ambulatory Visit (INDEPENDENT_AMBULATORY_CARE_PROVIDER_SITE_OTHER): Payer: 59 | Admitting: Family Medicine

## 2017-01-03 VITALS — BP 130/92 | HR 115 | Temp 102.9°F | Wt 235.0 lb

## 2017-01-03 DIAGNOSIS — G44009 Cluster headache syndrome, unspecified, not intractable: Secondary | ICD-10-CM | POA: Diagnosis not present

## 2017-01-03 DIAGNOSIS — M791 Myalgia: Secondary | ICD-10-CM | POA: Diagnosis not present

## 2017-01-03 DIAGNOSIS — R69 Illness, unspecified: Principal | ICD-10-CM

## 2017-01-03 DIAGNOSIS — R509 Fever, unspecified: Secondary | ICD-10-CM

## 2017-01-03 DIAGNOSIS — R05 Cough: Secondary | ICD-10-CM | POA: Diagnosis not present

## 2017-01-03 DIAGNOSIS — J111 Influenza due to unidentified influenza virus with other respiratory manifestations: Secondary | ICD-10-CM

## 2017-01-03 MED ORDER — OSELTAMIVIR PHOSPHATE 75 MG PO CAPS: 75.0000 mg | ORAL_CAPSULE | Freq: Two times a day (BID) | ORAL | 0 refills | Status: DC

## 2017-01-03 MED ORDER — HYDROCODONE-HOMATROPINE 5-1.5 MG/5ML PO SYRP: 5.0000 mL | ORAL_SOLUTION | Freq: Every evening | ORAL | 0 refills | Status: DC | PRN

## 2017-01-03 MED FILL — OSELTAMIVIR PHOSPHATE 75 MG: 75 | 5 days supply | Qty: 10 | Fill #0

## 2017-01-03 MED FILL — HYDROCODONE-HOMATROPINE SYR: 5-1.5 | 24 days supply | Qty: 120 | Fill #0

## 2017-01-03 NOTE — Progress Notes (Signed)
[name] [name] is a 46 y.o. male here for a new problem.  History of Present Illness:   Influenza  This is a new problem. The current episode started yesterday. The problem has been unchanged. Associated symptoms include arthralgias, chills, congestion, coughing, fatigue, a fever, headaches, myalgias and weakness. Pertinent negatives include no abdominal pain, chest pain, diaphoresis, joint swelling, nausea, neck pain, rash, sore throat, swollen glands, urinary symptoms, vertigo, visual change or vomiting. Nothing aggravates the symptoms. He has tried nothing for the symptoms.   PMHx, SurgHx, SocialHx, Medications, and Allergies were reviewed in the Visit Navigator and updated as appropriate.  Current Medications:   .  Omega-3 Fatty Acids (FISH OIL) 1000 MG CAPS, Take by mouth., Disp: , Rfl:  .  pantoprazole (PROTONIX) 40 MG tablet, Take 1 tablet (40 mg total) by mouth 2 (two) times daily., Disp: 60 tablet, Rfl: 1    Review of Systems:   Review of Systems  Constitutional: Positive for chills, fatigue and fever. Negative for diaphoresis.       Fever 102.9 started yesterday  HENT: Positive for congestion. Negative for sore throat.   Eyes: Negative.   Respiratory: Positive for cough.   Cardiovascular: Negative.  Negative for chest pain.  Gastrointestinal: Negative.  Negative for abdominal pain, nausea and vomiting.  Genitourinary: Negative.   Musculoskeletal: Positive for arthralgias and myalgias. Negative for joint swelling and neck pain.  Skin: Negative.  Negative for rash.  Neurological: Positive for weakness and headaches. Negative for vertigo.  Endo/Heme/Allergies: Negative.   Psychiatric/Behavioral: Negative.     Vitals:   Today's Vitals   01/03/17 1419  BP: (!) 130/92  Pulse: (!) 115  Temp: (!) 102.9 F (39.4 C)  TempSrc: Oral  SpO2: 96%  Weight: 235 lb (106.6 kg)    Physical Exam:   General: Alert, cooperative, appears stated age and no distress.  HEENT:   Normocephalic, without obvious abnormality, atraumatic. Conjunctivae/corneas clear. OP erythematous without tonsillar edema.  Lungs: Clear to auscultation bilaterally. Cough.  Heart:: Regular rate and rhythm, S1, S2 normal, no murmur, click, rub or gallop.  Abdomen: Soft, non-tender; bowel sounds normal; no masses,  no organomegaly.  Extremities: Extremities normal, atraumatic, no cyanosis or edema.  Pulses: 2+ and symmetric.  Skin: Skin color, texture, turgor normal. No rashes or lesions.  Neurologic: Alert and oriented X 3, normal strength and tone. Normal symmetric. reflexes. Normal coordination and gait.  Psych: Alert,oriented, in NAD with a full range of affect, normal behavior and no psychotic features    Assessment and Plan:    [name] [name] was seen today for generalized body aches.  Diagnoses and all orders for this visit:  Influenza-like illness -     oseltamivir (TAMIFLU) 75 MG capsule; Take 1 capsule (75 mg total) by mouth 2 (two) times daily. -     HYDROcodone-homatropine (HYCODAN) 5-1.5 MG/5ML syrup; Take 5 mLs by mouth at bedtime as needed for cough.  . Reviewed expectations re: course of current medical issues. . Discussed self-management of symptoms. . Outlined signs and symptoms indicating need for more acute intervention. . Patient verbalized understanding and all questions were answered. . See orders for this visit as documented in the electronic medical record. . Patient received an After-Visit Summary.   Briscoe Deutscher, D.O.

## 2017-01-03 NOTE — Progress Notes (Signed)
Pre visit review using our clinic review tool, if applicable. No additional management support is needed unless otherwise documented below in the visit note. 

## 2017-09-25 ENCOUNTER — Encounter: Payer: Self-pay | Admitting: Family Medicine

## 2017-09-25 ENCOUNTER — Ambulatory Visit (INDEPENDENT_AMBULATORY_CARE_PROVIDER_SITE_OTHER): Payer: Managed Care, Other (non HMO) | Admitting: Family Medicine

## 2017-09-25 ENCOUNTER — Ambulatory Visit: Payer: Managed Care, Other (non HMO) | Admitting: Family Medicine

## 2017-09-25 VITALS — BP 137/83 | HR 60 | Temp 98.2°F | Resp 20 | Ht 67.0 in | Wt 234.8 lb

## 2017-09-25 DIAGNOSIS — Z125 Encounter for screening for malignant neoplasm of prostate: Secondary | ICD-10-CM | POA: Diagnosis not present

## 2017-09-25 DIAGNOSIS — Z0000 Encounter for general adult medical examination without abnormal findings: Secondary | ICD-10-CM | POA: Diagnosis not present

## 2017-09-25 DIAGNOSIS — Z130 Encounter for screening for diseases of the blood and blood-forming organs and certain disorders involving the immune mechanism: Secondary | ICD-10-CM | POA: Diagnosis not present

## 2017-09-25 DIAGNOSIS — Z79899 Other long term (current) drug therapy: Secondary | ICD-10-CM

## 2017-09-25 DIAGNOSIS — E669 Obesity, unspecified: Secondary | ICD-10-CM | POA: Diagnosis not present

## 2017-09-25 DIAGNOSIS — K219 Gastro-esophageal reflux disease without esophagitis: Secondary | ICD-10-CM | POA: Diagnosis not present

## 2017-09-25 DIAGNOSIS — R748 Abnormal levels of other serum enzymes: Secondary | ICD-10-CM | POA: Diagnosis not present

## 2017-09-25 LAB — CBC WITH DIFFERENTIAL/PLATELET
Basophils Absolute: 0.1 10*3/uL (ref 0.0–0.1)
Basophils Relative: 0.9 % (ref 0.0–3.0)
Eosinophils Absolute: 0.7 10*3/uL (ref 0.0–0.7)
Eosinophils Relative: 10.6 % — ABNORMAL HIGH (ref 0.0–5.0)
HCT: 43.7 % (ref 39.0–52.0)
Hemoglobin: 15.3 g/dL (ref 13.0–17.0)
Lymphocytes Relative: 33.9 % (ref 12.0–46.0)
Lymphs Abs: 2.2 10*3/uL (ref 0.7–4.0)
MCHC: 35 g/dL (ref 30.0–36.0)
MCV: 97.6 fl (ref 78.0–100.0)
Monocytes Absolute: 0.4 10*3/uL (ref 0.1–1.0)
Monocytes Relative: 5.8 % (ref 3.0–12.0)
Neutro Abs: 3.2 10*3/uL (ref 1.4–7.7)
Neutrophils Relative %: 48.8 % (ref 43.0–77.0)
Platelets: 178 10*3/uL (ref 150.0–400.0)
RBC: 4.48 Mil/uL (ref 4.22–5.81)
RDW: 12 % (ref 11.5–15.5)
WBC: 6.6 10*3/uL (ref 4.0–10.5)

## 2017-09-25 LAB — LIPID PANEL
Cholesterol: 215 mg/dL — ABNORMAL HIGH (ref 0–200)
HDL: 37.6 mg/dL — ABNORMAL LOW (ref >39.00)
LDL Cholesterol: 138 mg/dL — ABNORMAL HIGH (ref 0–99)
NonHDL: 177.47
Total CHOL/HDL Ratio: 6
Triglycerides: 199 mg/dL — ABNORMAL HIGH (ref 0.0–149.0)
VLDL: 39.8 mg/dL (ref 0.0–40.0)

## 2017-09-25 LAB — COMPREHENSIVE METABOLIC PANEL
ALT: 42 U/L (ref 0–53)
AST: 30 U/L (ref 0–37)
Albumin: 4.4 g/dL (ref 3.5–5.2)
Alkaline Phosphatase: 94 U/L (ref 39–117)
BUN: 13 mg/dL (ref 6–23)
CO2: 30 mEq/L (ref 19–32)
Calcium: 9.9 mg/dL (ref 8.4–10.5)
Chloride: 103 mEq/L (ref 96–112)
Creatinine, Ser: 0.99 mg/dL (ref 0.40–1.50)
GFR: 85.63 mL/min (ref >60.00)
Glucose, Bld: 99 mg/dL (ref 70–99)
Potassium: 4.2 mEq/L (ref 3.5–5.1)
Sodium: 138 mEq/L (ref 135–145)
Total Bilirubin: 0.9 mg/dL (ref 0.2–1.2)
Total Protein: 7.3 g/dL (ref 6.0–8.3)

## 2017-09-25 LAB — TSH: TSH: 2.69 u[IU]/mL (ref 0.35–4.50)

## 2017-09-25 LAB — PSA: PSA: 0.89 ng/mL (ref 0.10–4.00)

## 2017-09-25 LAB — HEMOGLOBIN A1C: Hgb A1c MFr Bld: 5.5 % (ref 4.6–6.5)

## 2017-09-25 MED ORDER — ESOMEPRAZOLE MAGNESIUM 20 MG PO CPDR: 20.0000 mg | DELAYED_RELEASE_CAPSULE | Freq: Every day | ORAL | 3 refills | Status: AC

## 2017-09-25 NOTE — Progress Notes (Signed)
Patient ID: [name] [name], male  DOB: 1971-09-30, 46 y.o.   MRN: [id number] Patient Care Team    Relationship Specialty Notifications Start End  Ma Hillock, DO PCP - General Family Medicine  06/25/16     Subjective:  [name] [name] is a 45 y.o.  male present for CPE All past medical history, surgical history, allergies, family history, immunizations, medications and social history were updated in the electronic medical record today. All recent labs, ED visits and hospitalizations within the last year were reviewed today.  Health maintenance: updated/reviewed 09/25/2017 Colonoscopy: completed 2006,  Screen 48, prior normal for pain. No fhx.  Immunizations: tdap UTD 06/25/2016, Influenza 08/14/2017 UTD (encouraged yearly) Infectious disease screening: HIV completed 07/03/2016 PSA:  Lab Results  Component Value Date   PSA 0.60 07/03/2016  . Discussed prostate screening testing, patient is amendable to having blood screening, does not desire GU exam.  Depression screen [facility] 2/9 09/25/2017 06/25/2016  Decreased Interest 0 0  Down, Depressed, Hopeless 0 0  PHQ - 2 Score 0 0    Immunization History  Administered Date(s) Administered  . Influenza-Unspecified 07/30/2015, 08/04/2016, 08/14/2017  . Tdap 06/25/2016    Past Medical History:  Diagnosis Date  . Fatty liver   . GERD (gastroesophageal reflux disease)   . IBS (irritable bowel syndrome)   . Kidney stone   . Obesity    No Known Allergies Past Surgical History:  Procedure Laterality Date  . NO PAST SURGERIES     Family History  Problem Relation Age of Onset  . Cirrhosis Paternal Grandfather    Social History   Socioeconomic History  . Marital status: Married    Spouse name: Not on file  . Number of children: 3  . Years of education: Not on file  . Highest education level: Not on file  Social Needs  . Financial resource strain: Not on file  . Food insecurity - worry: Not on file  . Food  insecurity - inability: Not on file  . Transportation needs - medical: Not on file  . Transportation needs - non-medical: Not on file  Occupational History  . Occupation: roofer  Tobacco Use  . Smoking status: Never Smoker  . Smokeless tobacco: Never Used  Substance and Sexual Activity  . Alcohol use: Yes    Alcohol/week: 0.0 oz    Comment: occ  . Drug use: No  . Sexual activity: Yes    Partners: Female    Birth control/protection: None    Comment: Married  Other Topics Concern  . Not on file  Social History Narrative   Married to [place]. Has 3 children Cornelius Moras and [employer] school education. Works as a Theme park manager.   Drinks caffeine, uses herbal remedies.   Wears his seatbelt, smoke detectors in the house.   No firearms in the house.   Feels safe in his relationships.   Allergies as of 09/25/2017   No Known Allergies     Medication List        Accurate as of 09/25/17 10:47 AM. Always use your most recent med list.          esomeprazole 20 MG capsule Commonly known as:  NEXIUM Take 1 capsule (20 mg total) by mouth daily at 12 noon.   Fish Oil 1000 MG Caps Take by mouth.      No results found for this or any previous visit (from the past 2160 hour(s)).  ROS: 14  pt review of systems performed and negative (unless mentioned in an HPI)  Objective: BP 137/83 (BP Location: Right Arm, Patient Position: Sitting, Cuff Size: Large)   Pulse 60   Temp 98.2 F (36.8 C)   Resp 20   Ht _0  (1.702 m)   Wt 234 lb 12 oz (106.5 kg)   SpO2 97%   BMI 36.77 kg/m  Gen: Afebrile. No acute distress. Nontoxic in appearance, well-developed, well-nourished, obese Hispanic male. HENT: AT. [place]. Bilateral TM visualized and normal in appearance. MMM. Bilateral nares without erythema or drainage. Throat without erythema or exudates. No cough or hoarseness Eyes:Pupils Equal Round Reactive to light, Extraocular movements intact,  Conjunctiva without redness, discharge or  icterus. Neck/lymp/endocrine: Supple, no lymphadenopathy, no thyromegaly CV: RRR no murmur, no edema, +2/4 P posterior tibialis pulses Chest: CTAB, no wheeze or crackles. Normal work of breath, good air movement. Abd: Soft. Obese. NTND. BS present. No Masses palpated.  Skin: No rashes, purpura or petechiae.  Neuro/msk:  Normal gait. PERLA. EOMi. Alert. Oriented x3 Cranial nerves II through XII intact. Muscle strength 5/5 upper and lower extremity. DTRs equal bilaterally. Psych: Normal affect, dress and demeanor. Normal speech. Normal thought content and judgment.  Assessment/plan: [name] [name] is a 46 y.o. male present for CPE.  Encounter for preventive health examination Patient was encouraged to exercise greater than 150 minutes a week. Patient was encouraged to choose a diet filled with fresh fruits and vegetables, and lean meats. AVS provided to patient today for education/recommendation on gender specific health and safety maintenance. Colonoscopy up-to-date, screen at 48 Infectious disease screening completed. Influenza vaccination up-to-date Tetanus vaccination up-to-date PSA 07/03/2016 normal 0.60>> collected today Gastroesophageal reflux disease without esophagitis Patient establish with gastroenterology encourage him to follow-up with him this is not controlled. Agreed to refill Nexium for him. Obesity (BMI 30-39.9) Diet and exercise modification - Lipid panel - HgB A1c - TSH Elevated liver enzymes - CBC - Comp Met (CMET) - Patient established with gastroenterology Screening for deficiency anemia - CBC w/Diff Encounter for long-term (current) use of medications - long term ppi use - CBC w/Diff  Return in about 1 year (around 09/25/2018) for CPE.   Electronically signed by: Howard Pouch, DO [place]

## 2017-09-25 NOTE — Patient Instructions (Signed)

## 2017-09-26 ENCOUNTER — Encounter: Payer: Self-pay | Admitting: *Deleted

## 2017-09-26 ENCOUNTER — Telehealth: Payer: Self-pay | Admitting: Family Medicine

## 2017-09-26 NOTE — Telephone Encounter (Signed)
Please inform patient his labs from his physical are all within normal range with the exception of his triglycerides which is part of the cholesterol panel. I would like him to increase his fish oil supplement to 3000 mg a day. In healthy diet full of low saturated fat and higher fiber will also be helpful. Increase exercise greater than 150 minutes a week. Lipid Panel     Component Value Date/Time   CHOL 215 (H) 09/25/2017 0952   TRIG 199.0 (H) 09/25/2017 0952   HDL 37.60 (L) 09/25/2017 0952   CHOLHDL 6 09/25/2017 0952   VLDL 39.8 09/25/2017 0952   LDLCALC 138 (H) 09/25/2017 6789

## 2017-09-26 NOTE — Telephone Encounter (Signed)
Left message with lab results and detailed instructions on patient voice mail per DPR also sent information in My Chart.

## 2018-03-19 IMAGING — US US RENAL
1 series · 14 of 25 positions shown · non-contrast
Comparison: None.

CLINICAL DATA: Left flank pain radiating to the left pelvis

EXAM:
RENAL / URINARY TRACT ULTRASOUND COMPLETE

[Series 1: us renal · 0.27mm/px · 14 of 25 slices shown]
[im 1/25]
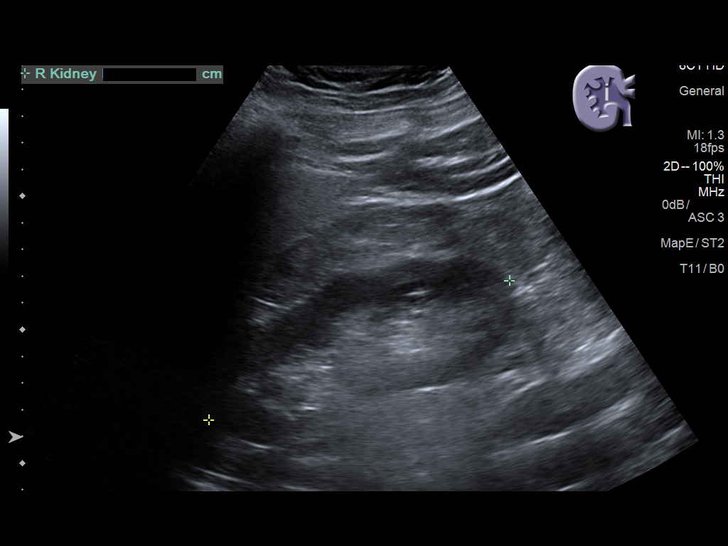
[im 3/25]
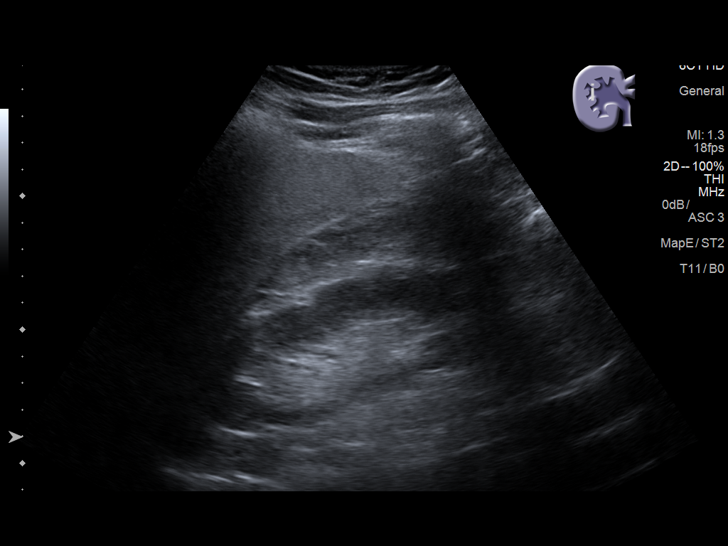
[im 5/25]
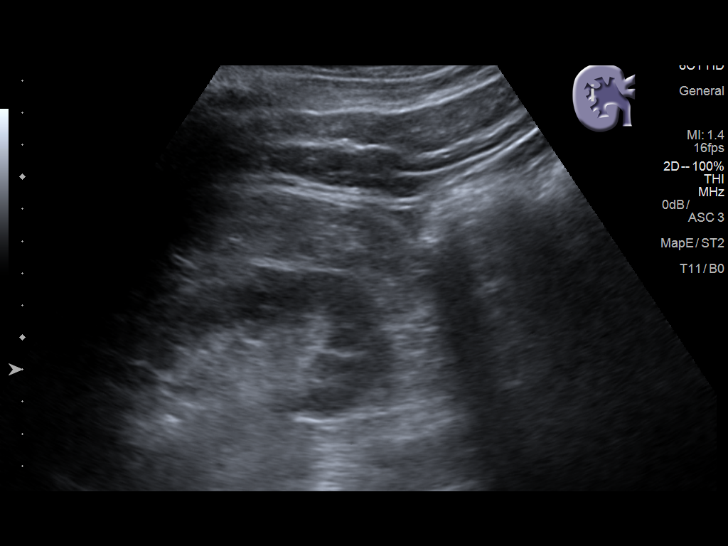
[im 7/25]
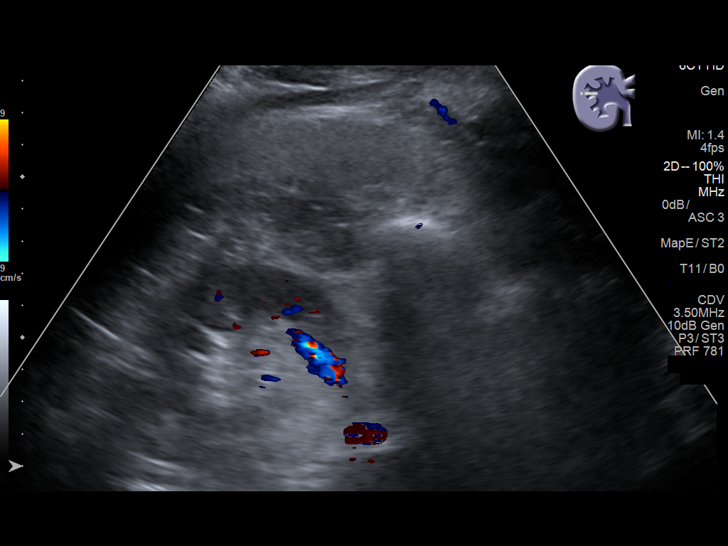
[im 9/25]
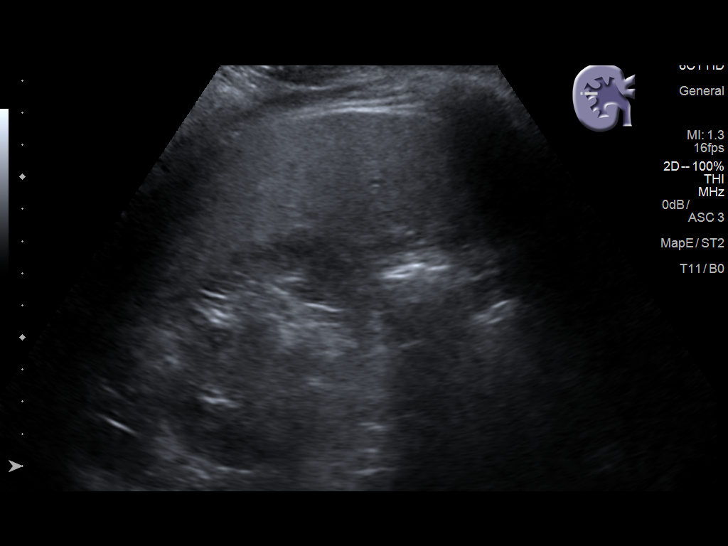
[im 10/25]
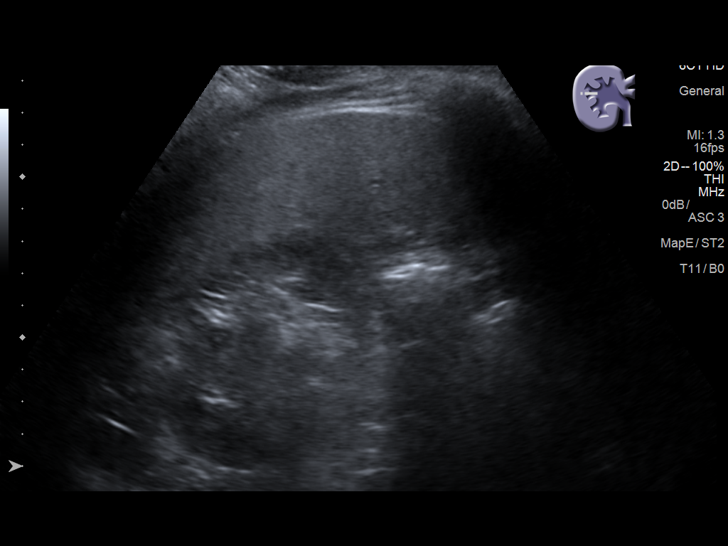
[im 12/25]
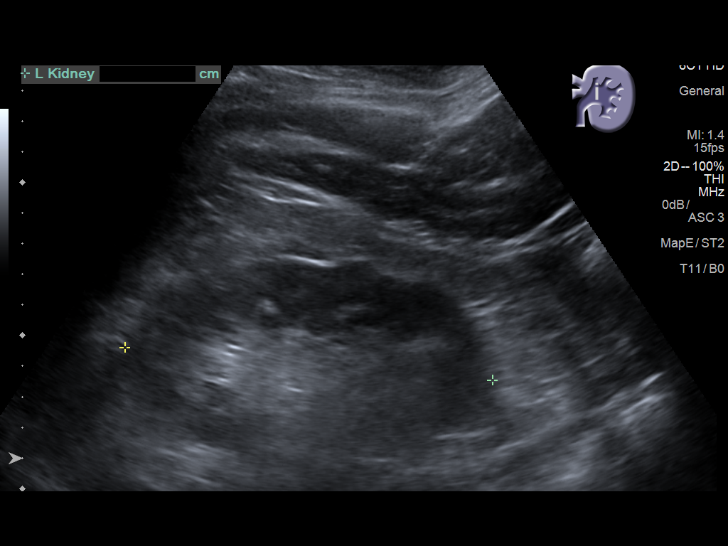
[im 14/25]
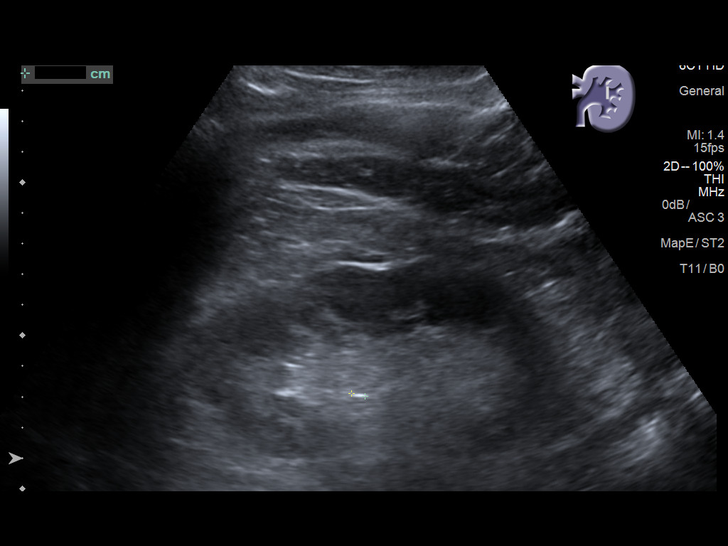
[im 16/25]
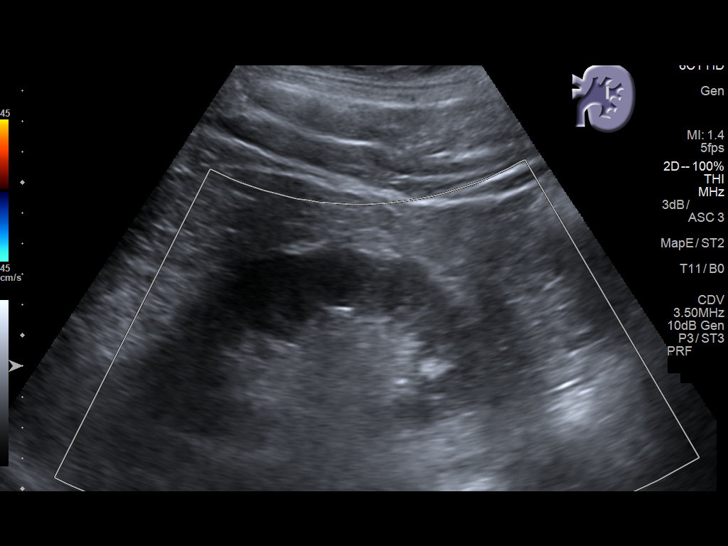
[im 17/25]
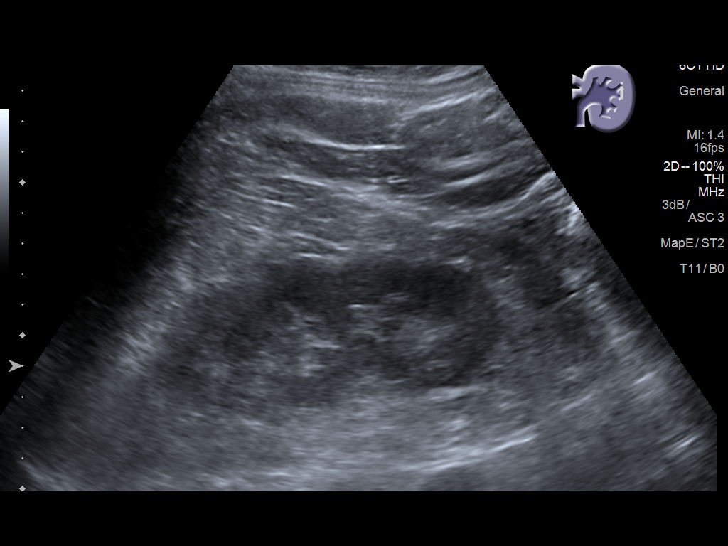
[im 19/25]
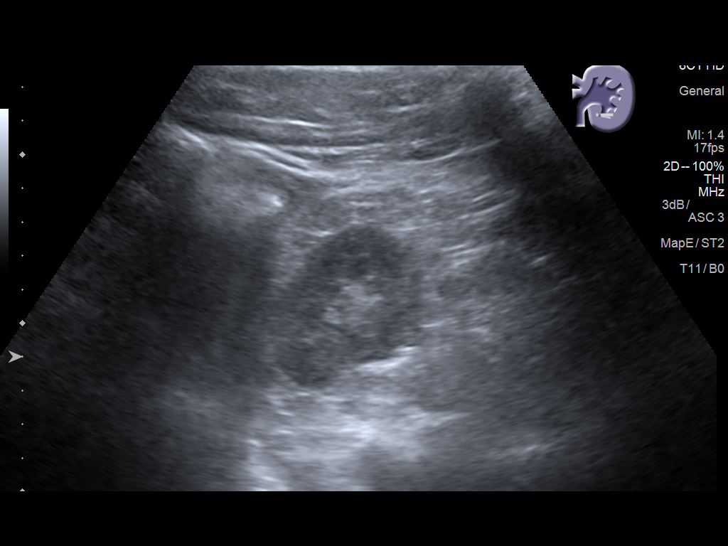
[im 21/25]
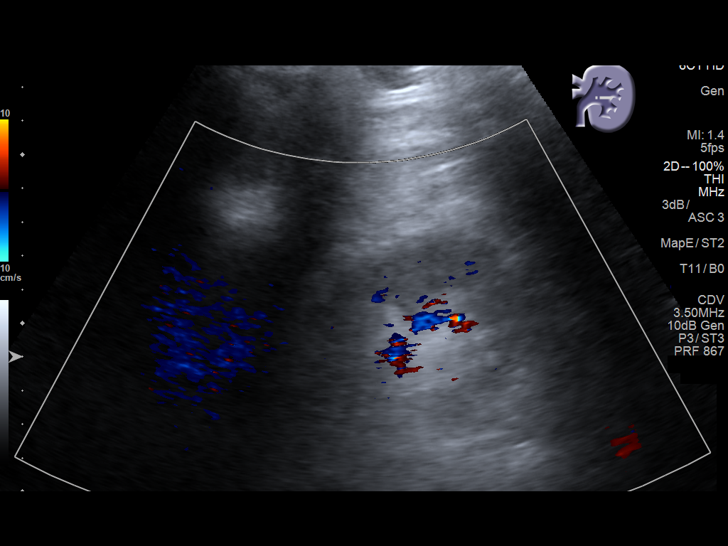
[im 23/25]
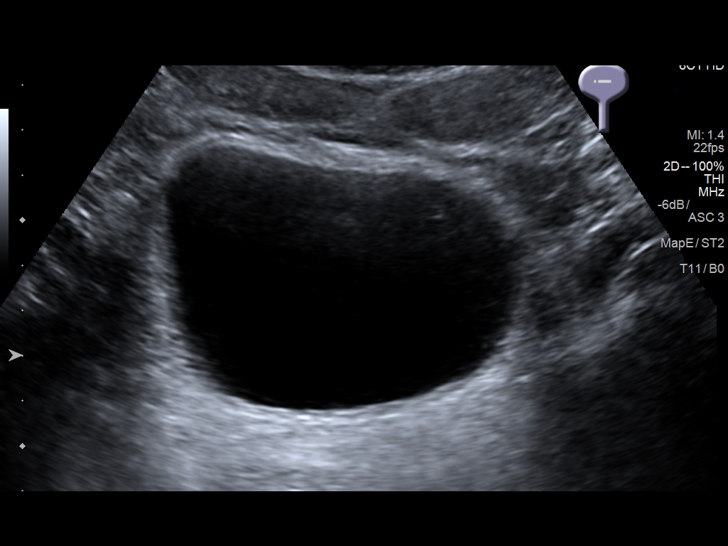
[im 25/25]
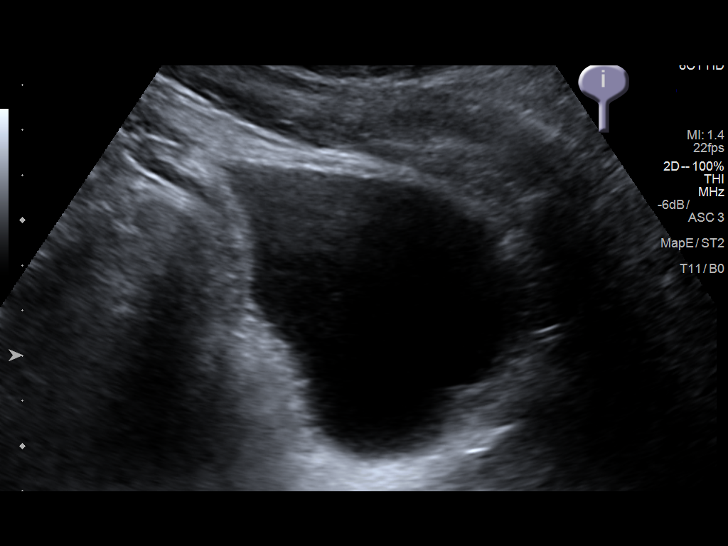

[14 of 25 positions shown; findings below may reference images not displayed]

FINDINGS: Right Kidney:

Length: 12.4 cm. Echogenicity within normal limits. 6 mm calculus in
the interpolar aspect of the right kidney. No mass or hydronephrosis
visualized.

Left Kidney:

Length: 12.1 cm. Echogenicity within normal limits. Multiple
echogenic foci in the left kidney consistent with nephrolithiasis.
No mass or hydronephrosis visualized.

Bladder:

Appears normal for degree of bladder distention.
IMPRESSION: 1. Bilateral nonobstructing renal calculi.

## 2018-08-02 IMAGING — US US ABDOMEN COMPLETE
1 series · 13 of 25 positions shown · non-contrast
Comparison: Ultrasound 05/09/2016, CT 05/21/2009

CLINICAL DATA: 45-year-old male with a history of GERD and left
upper quadrant pain

EXAM:
ABDOMEN ULTRASOUND COMPLETE

[Series 1: us abdomen complete · 0.22mm/px · 13 of 84 slices shown]
[im 1/84]
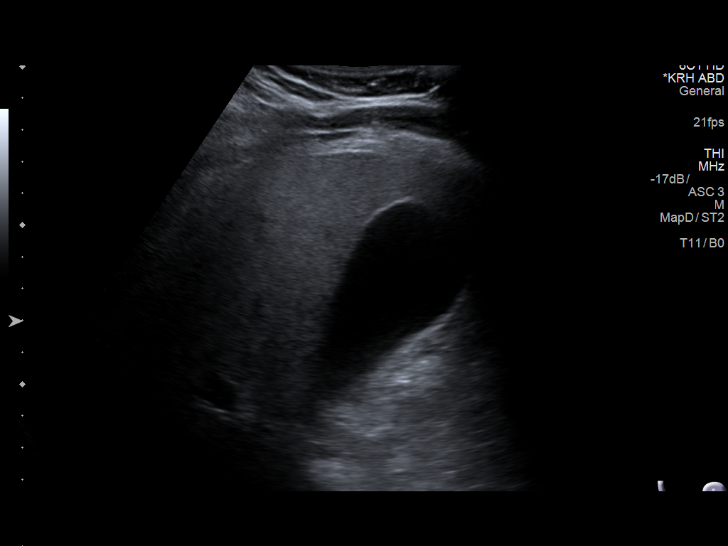
[im 7/84]
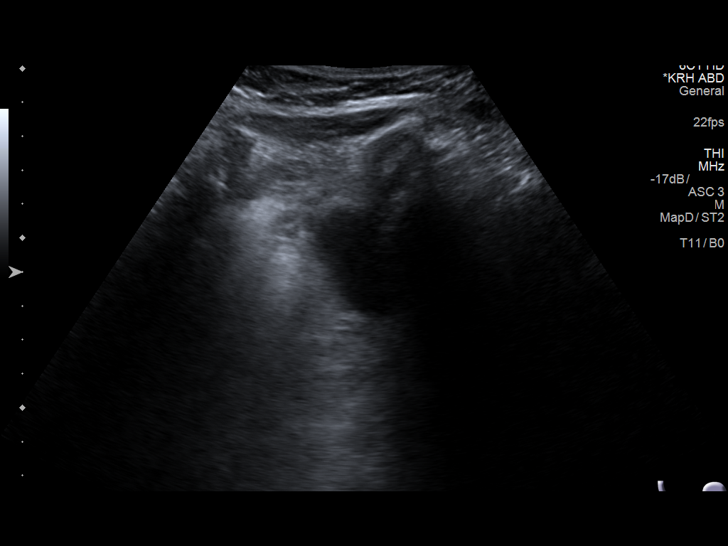
[im 14/84]
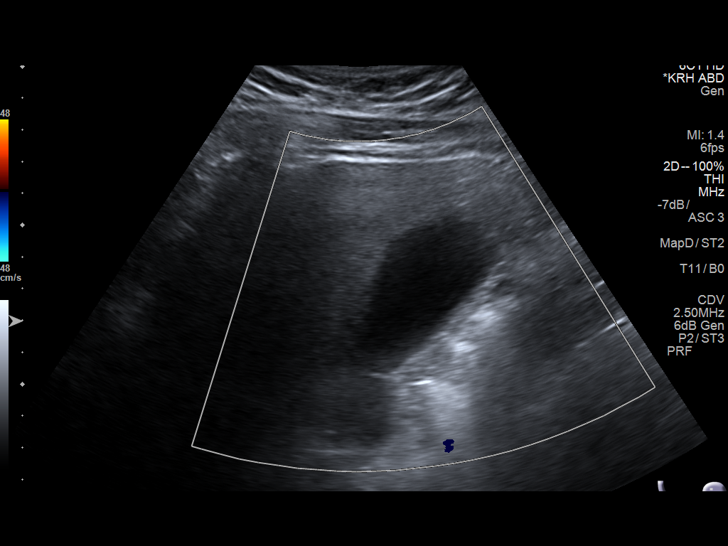
[im 21/84]
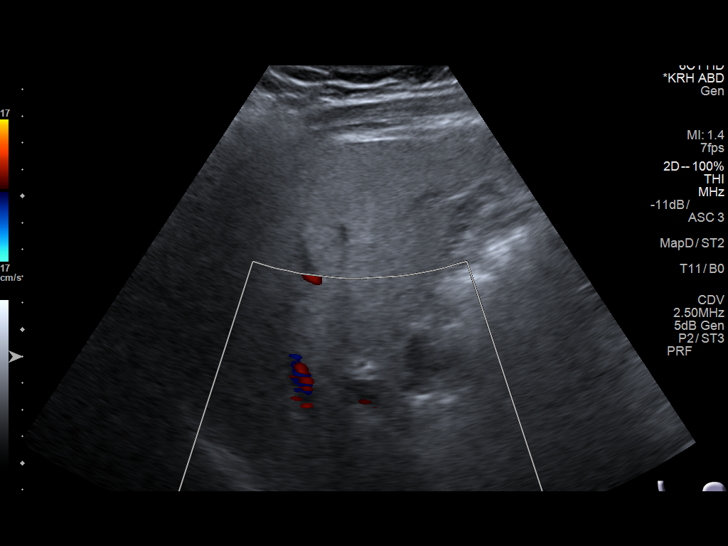
[im 28/84]
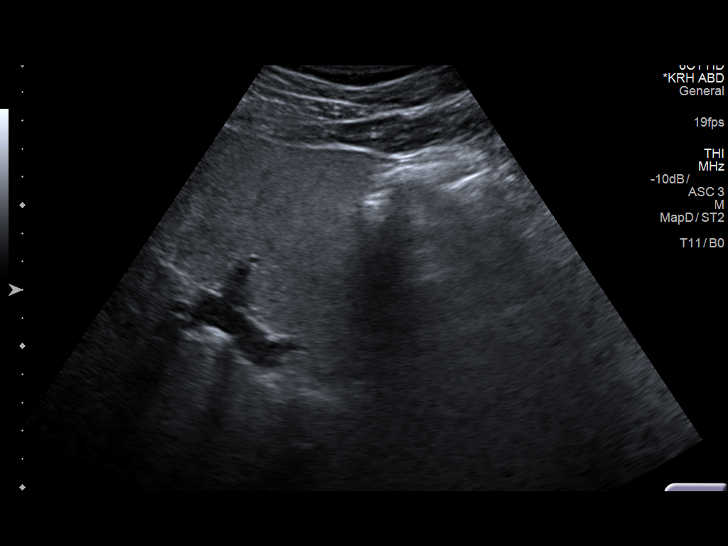
[im 35/84]
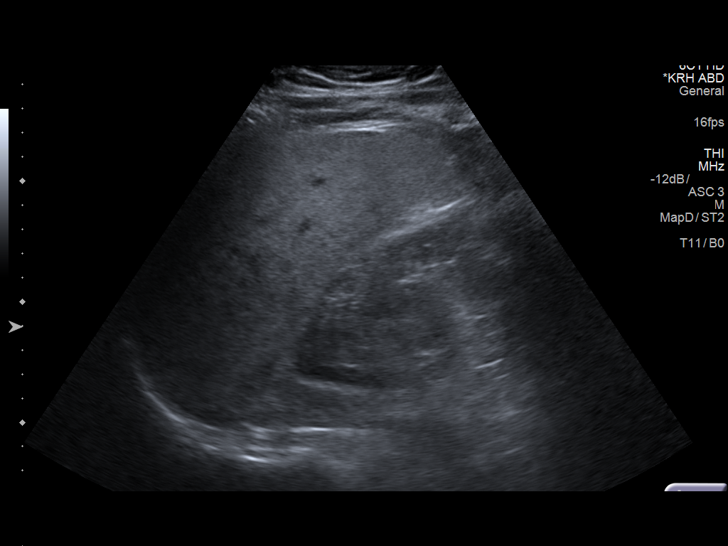
[im 42/84]
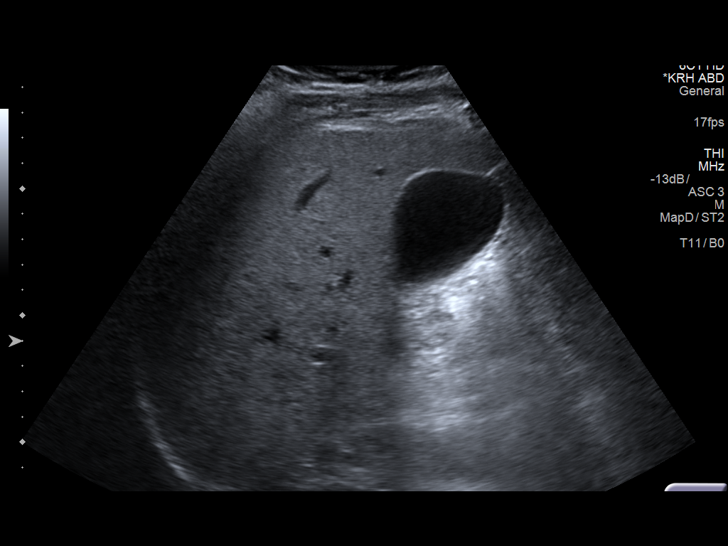
[im 49/84]
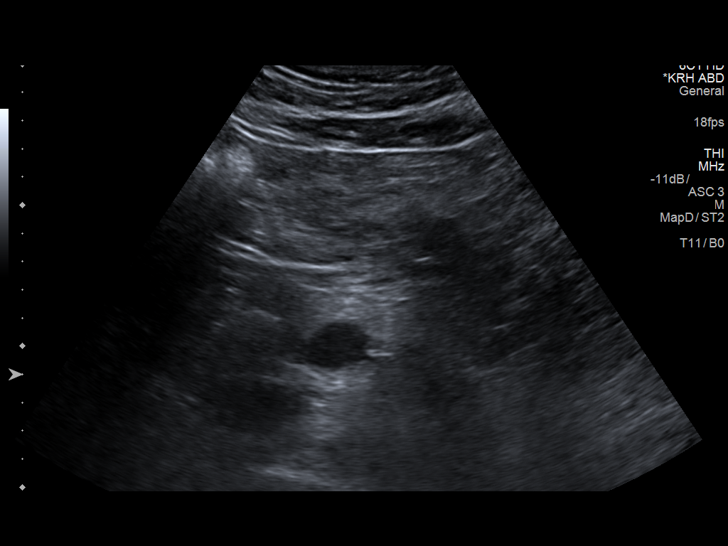
[im 56/84]
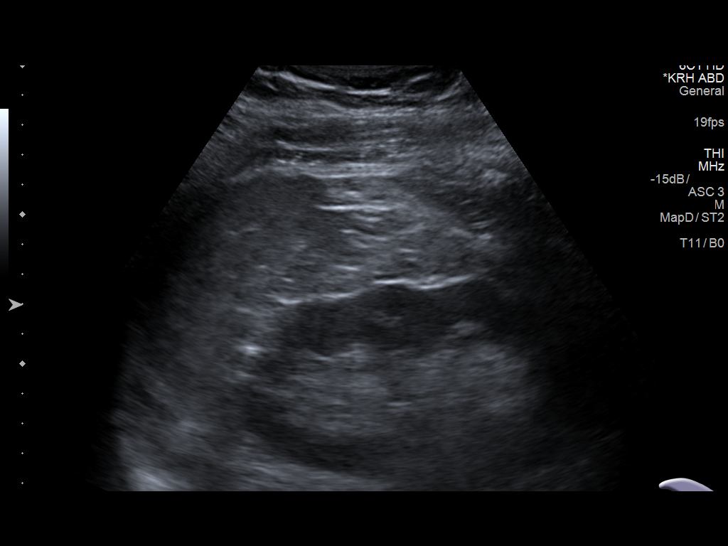
[im 63/84]
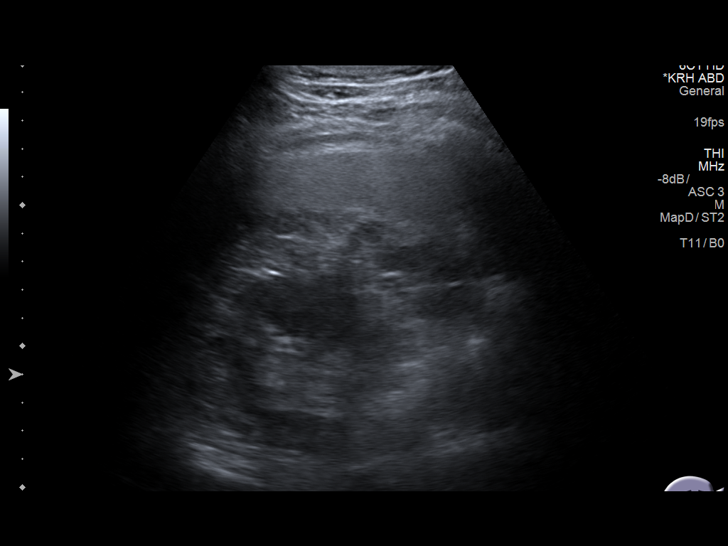
[im 70/84]
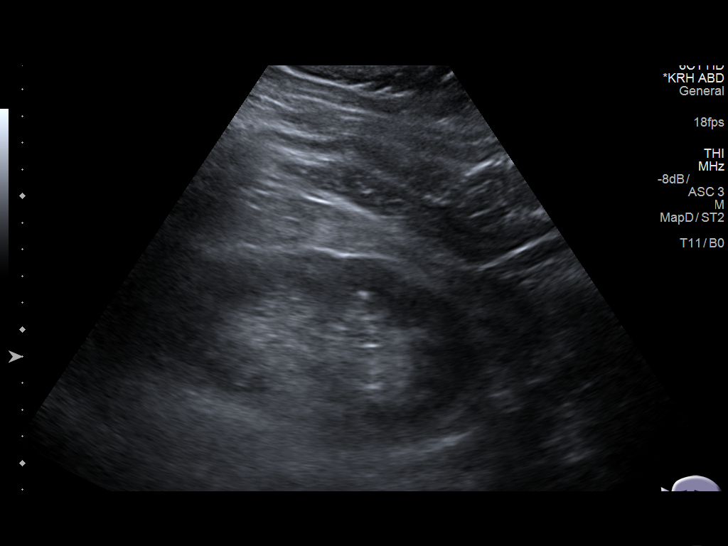
[im 77/84]
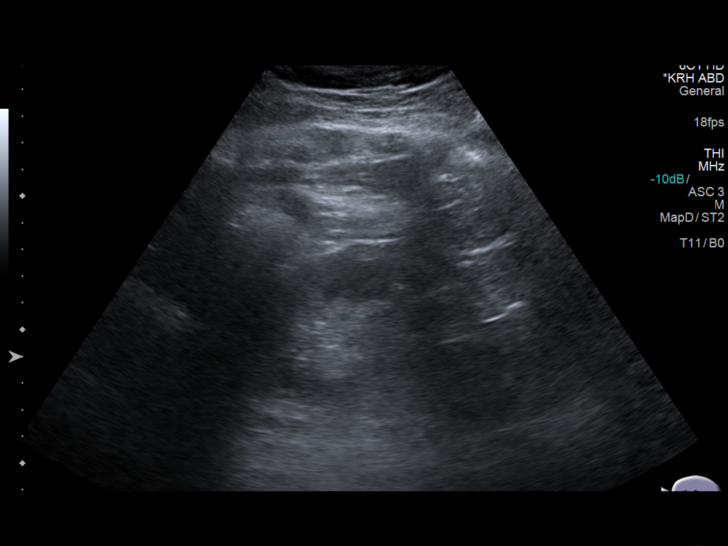
[im 84/84]
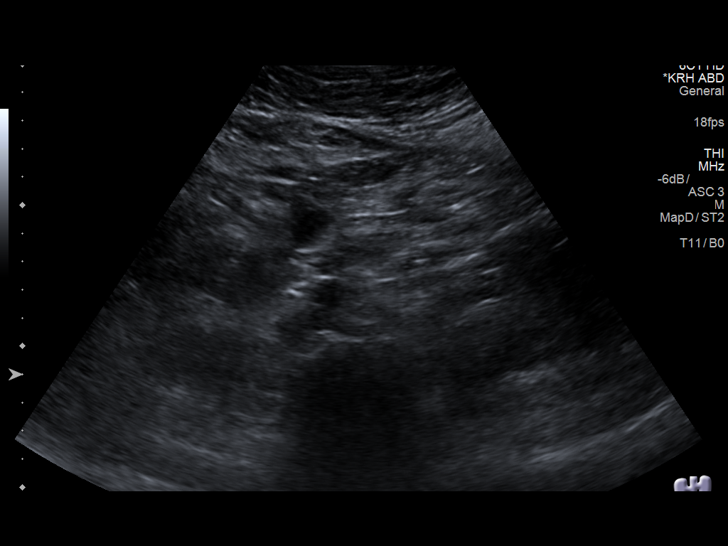

[13 of 25 positions shown; findings below may reference images not displayed]

FINDINGS: Gallbladder: No pericholecystic fluid or inflammatory changes. No
sonographic Murphy's sign. Dependently layered echogenic material,
potentially microlithiasis/ sludge with no wall shadowing. No
gallbladder wall thickening.

Common bile duct: Diameter: 2 mm -3 mm

Liver: Heterogeneously echogenic liver parenchyma with no evidence
of surface nodularity.

IVC: No abnormality visualized.

Pancreas: Visualized portion unremarkable.

Spleen: Size and appearance within normal limits.

Right Kidney: Length: 12.4 cm. Echogenic focus with posterior ring
down in the interpolar region of the right kidney. No
hydronephrosis.

Left Kidney: Length: 13.1 cm. Rounded echogenic focus in the
interpolar cortex, unchanged from the comparison ultrasound survey.
No hydronephrosis.

Abdominal aorta: No aneurysm visualized.

Other findings: None.
IMPRESSION: Cholelithiasis without sonographic evidence of acute cholecystitis.

Evidence of liver steatosis.

Bilateral renal calculi, without hydronephrosis, which were present
on the comparison ultrasound survey of 05/09/2016.

## 2018-09-03 ENCOUNTER — Encounter: Payer: Self-pay | Admitting: Family Medicine

## 2018-09-03 ENCOUNTER — Ambulatory Visit (INDEPENDENT_AMBULATORY_CARE_PROVIDER_SITE_OTHER): Payer: 59 | Admitting: Family Medicine

## 2018-09-03 VITALS — BP 127/89 | HR 59 | Temp 97.7°F | Resp 20 | Ht 67.0 in | Wt 230.2 lb

## 2018-09-03 DIAGNOSIS — Z125 Encounter for screening for malignant neoplasm of prostate: Secondary | ICD-10-CM | POA: Diagnosis not present

## 2018-09-03 DIAGNOSIS — E782 Mixed hyperlipidemia: Secondary | ICD-10-CM

## 2018-09-03 DIAGNOSIS — K760 Fatty (change of) liver, not elsewhere classified: Secondary | ICD-10-CM

## 2018-09-03 DIAGNOSIS — Z131 Encounter for screening for diabetes mellitus: Secondary | ICD-10-CM | POA: Diagnosis not present

## 2018-09-03 DIAGNOSIS — Z0000 Encounter for general adult medical examination without abnormal findings: Secondary | ICD-10-CM | POA: Diagnosis not present

## 2018-09-03 DIAGNOSIS — L918 Other hypertrophic disorders of the skin: Secondary | ICD-10-CM

## 2018-09-03 DIAGNOSIS — R748 Abnormal levels of other serum enzymes: Secondary | ICD-10-CM

## 2018-09-03 DIAGNOSIS — E669 Obesity, unspecified: Secondary | ICD-10-CM | POA: Diagnosis not present

## 2018-09-03 LAB — LIPID PANEL
Cholesterol: 201 mg/dL — ABNORMAL HIGH (ref 0–200)
HDL: 37.4 mg/dL — ABNORMAL LOW (ref >39.00)
LDL Cholesterol: 133 mg/dL — ABNORMAL HIGH (ref 0–99)
NonHDL: 164.06
Total CHOL/HDL Ratio: 5
Triglycerides: 156 mg/dL — ABNORMAL HIGH (ref 0.0–149.0)
VLDL: 31.2 mg/dL (ref 0.0–40.0)

## 2018-09-03 LAB — CBC WITH DIFFERENTIAL/PLATELET
Basophils Absolute: 0 10*3/uL (ref 0.0–0.1)
Basophils Relative: 0.4 % (ref 0.0–3.0)
Eosinophils Absolute: 0.5 10*3/uL (ref 0.0–0.7)
Eosinophils Relative: 6.8 % — ABNORMAL HIGH (ref 0.0–5.0)
HCT: 41.6 % (ref 39.0–52.0)
Hemoglobin: 14.8 g/dL (ref 13.0–17.0)
Lymphocytes Relative: 38.7 % (ref 12.0–46.0)
Lymphs Abs: 2.9 10*3/uL (ref 0.7–4.0)
MCHC: 35.6 g/dL (ref 30.0–36.0)
MCV: 95.6 fl (ref 78.0–100.0)
Monocytes Absolute: 0.4 10*3/uL (ref 0.1–1.0)
Monocytes Relative: 5.1 % (ref 3.0–12.0)
Neutro Abs: 3.6 10*3/uL (ref 1.4–7.7)
Neutrophils Relative %: 49 % (ref 43.0–77.0)
Platelets: 183 10*3/uL (ref 150.0–400.0)
RBC: 4.35 Mil/uL (ref 4.22–5.81)
RDW: 12.3 % (ref 11.5–15.5)
WBC: 7.4 10*3/uL (ref 4.0–10.5)

## 2018-09-03 LAB — COMPREHENSIVE METABOLIC PANEL
ALT: 64 U/L — ABNORMAL HIGH (ref 0–53)
AST: 47 U/L — ABNORMAL HIGH (ref 0–37)
Albumin: 4.6 g/dL (ref 3.5–5.2)
Alkaline Phosphatase: 68 U/L (ref 39–117)
BUN: 11 mg/dL (ref 6–23)
CO2: 29 mEq/L (ref 19–32)
Calcium: 9.5 mg/dL (ref 8.4–10.5)
Chloride: 102 mEq/L (ref 96–112)
Creatinine, Ser: 0.89 mg/dL (ref 0.40–1.50)
GFR: 96.45 mL/min (ref >60.00)
Glucose, Bld: 87 mg/dL (ref 70–99)
Potassium: 3.9 mEq/L (ref 3.5–5.1)
Sodium: 138 mEq/L (ref 135–145)
Total Bilirubin: 1.3 mg/dL — ABNORMAL HIGH (ref 0.2–1.2)
Total Protein: 7.3 g/dL (ref 6.0–8.3)

## 2018-09-03 LAB — TSH: TSH: 1.82 u[IU]/mL (ref 0.35–4.50)

## 2018-09-03 LAB — HEMOGLOBIN A1C: Hgb A1c MFr Bld: 5.6 % (ref 4.6–6.5)

## 2018-09-03 LAB — PSA: PSA: 0.97 ng/mL (ref 0.10–4.00)

## 2018-09-03 NOTE — Progress Notes (Signed)
Patient ID: ZALMAN HULL, male  DOB: 20-Oct-1971, 47 y.o.   MRN: [id number] Patient Care Team    Relationship Specialty Notifications Start End  Ma Hillock, DO PCP - General Family Medicine  06/25/16     Chief Complaint  Patient presents with  . Annual Exam    Subjective:  [name] [name] is a 47 y.o. male present for CPE. All past medical history, surgical history, allergies, family history, immunizations, medications and social history were updated in the electronic medical record today. All recent labs, ED visits and hospitalizations within the last year were reviewed.  Health maintenance: updated 09/03/18 Colonoscopy: completed 2006,  Screen 48, prior normal for pain. No fhx.  Immunizations: tdap UTD 06/25/2016, Influenza 07/2018 UTD (encouraged yearly) Infectious disease screening: HIV completed 07/03/2016 Lab Results  Component Value Date   PSA 0.89 09/25/2017   PSA 0.60 07/03/2016  , pt was counseled on prostate cancer screenings.  Assistive device: none Oxygen XTG:GYIR Patient has a Dental home. Hospitalizations/ED visits: reviewed  Depression screen [facility] 2/9 09/03/2018 09/25/2017 06/25/2016  Decreased Interest 0 0 0  Down, Depressed, Hopeless 0 0 0  PHQ - 2 Score 0 0 0   No flowsheet data found.   Current Exercise Habits: The patient does not participate in regular exercise at present Exercise limited by: None identified Fall Risk  09/25/2017 06/25/2016  Falls in the past year? No No    Immunization History  Administered Date(s) Administered  . Influenza,inj,Quad PF,6+ Mos 07/30/2018  . Influenza-Unspecified 07/30/2015, 08/04/2016, 08/14/2017  . Tdap 06/25/2016     Past Medical History:  Diagnosis Date  . Fatty liver   . GERD (gastroesophageal reflux disease)   . IBS (irritable bowel syndrome)   . Kidney stone   . Obesity    No Known Allergies Past Surgical History:  Procedure Laterality Date  . NO PAST SURGERIES     Family History   Problem Relation Age of Onset  . Cirrhosis Paternal Grandfather    Social History   Socioeconomic History  . Marital status: Married    Spouse name: Not on file  . Number of children: 3  . Years of education: Not on file  . Highest education level: Not on file  Occupational History  . Occupation: roofer  Social Needs  . Financial resource strain: Not on file  . Food insecurity:    Worry: Not on file    Inability: Not on file  . Transportation needs:    Medical: Not on file    Non-medical: Not on file  Tobacco Use  . Smoking status: Never Smoker  . Smokeless tobacco: Never Used  Substance and Sexual Activity  . Alcohol use: Yes    Alcohol/week: 0.0 standard drinks    Comment: occ  . Drug use: No  . Sexual activity: Yes    Partners: Female    Birth control/protection: None    Comment: Married  Lifestyle  . Physical activity:    Days per week: Not on file    Minutes per session: Not on file  . Stress: Not on file  Relationships  . Social connections:    Talks on phone: Not on file    Gets together: Not on file    Attends religious service: Not on file    Active member of club or organization: Not on file    Attends meetings of clubs or organizations: Not on file    Relationship status: Not on file  .  Intimate partner violence:    Fear of current or ex partner: Not on file    Emotionally abused: Not on file    Physically abused: Not on file    Forced sexual activity: Not on file  Other Topics Concern  . Not on file  Social History Narrative   Married to [place]. Has 3 children Cornelius Moras and [employer] school education. Works as a Theme park manager.   Drinks caffeine, uses herbal remedies.   Wears his seatbelt, smoke detectors in the house.   No firearms in the house.   Feels safe in his relationships.   Allergies as of 09/03/2018   No Known Allergies     Medication List        Accurate as of 09/03/18  2:08 PM. Always use your most recent med list.           esomeprazole 20 MG capsule Commonly known as:  NEXIUM Take 1 capsule (20 mg total) by mouth daily at 12 noon.   Fish Oil 1000 MG Caps Take by mouth.      All past medical history, surgical history, allergies, family history, immunizations andmedications were updated in the EMR today and reviewed under the history and medication portions of their EMR.     No results found for this or any previous visit (from the past 2160 hour(s)).  ROS: 14 pt review of systems performed and negative (unless mentioned in an HPI)  Objective: BP 127/89 (BP Location: Left Arm, Patient Position: Sitting, Cuff Size: Large)   Pulse (!) 59   Temp 97.7 F (36.5 C)   Resp 20   Ht 5\' 7"  (1.702 m)   Wt 230 lb 4 oz (104.4 kg)   SpO2 98%   BMI 36.06 kg/m  Gen: Afebrile. No acute distress. Nontoxic in appearance, well-developed, well-nourished,  Pleasant male.  HENT: AT. [place]. Bilateral TM visualized and normal in appearance, normal external auditory canal. MMM, no oral lesions, adequate dentition. Bilateral nares within normal limits. Throat without erythema, ulcerations or exudates. no Cough on exam, no hoarseness on exam. Eyes:Pupils Equal Round Reactive to light, Extraocular movements intact,  Conjunctiva without redness, discharge or icterus. Neck/lymp/endocrine: Supple,no lymphadenopathy, no thyromegaly CV: RRR no murmur, no edema, +2/4 P posterior tibialis pulses. no carotid bruits. No JVD. Chest: CTAB, no wheeze, rhonchi or crackles. normal Respiratory effort. good Air movement. Abd: Soft. obese. NTND. BS present. no Masses palpated. No hepatosplenomegaly. No rebound tenderness or guarding. Skin: no rashes, purpura or petechiae. Warm and well-perfused. Skin intact. Skin tags present Neuro/Msk:  Normal gait. PERLA. EOMi. Alert. Oriented x3.  Cranial nerves II through XII intact. Muscle strength 5/5 upper/lower extremity. DTRs equal bilaterally. Psych: Normal affect, dress and demeanor. Normal speech.  Normal thought content and judgment.  No exam data present  Assessment/plan: [name] [name] is a 47 y.o. male present for CPE. Screening for diabetes mellitus - HgB A1c Mixed hyperlipidemia/Obesity (BMI 30-39.9)/Abnormal liver enzymes/hepatic steatosis  - intermittently taking fish oil only - Lipid panel - TSH - CBC w/Diff - Lipid panel Prostate cancer screening - PSA Skin tags, multiple acquired - Ambulatory referral to Dermatology Encounter for preventive health examination Patient was encouraged to exercise greater than 150 minutes a week. Patient was encouraged to choose a diet filled with fresh fruits and vegetables, and lean meats. AVS provided to patient today for education/recommendation on gender specific health and safety maintenance. - colonoscopy due next year  - immunizations UTD - ID screen UTD -  PSA collected.   Return in about 1 year (around 09/04/2019) for CPE.  Note is dictated utilizing voice recognition software. Although note has been proof read prior to signing, occasional typographical errors still can be missed. If any questions arise, please do not hesitate to call for verification.  Electronically signed by: [name] Pouch, DO [place]

## 2018-09-03 NOTE — Patient Instructions (Addendum)
I have referred you to dermatology for your skin tags.    Health Maintenance, Male A healthy lifestyle and preventive care is important for your health and wellness. Ask your health care provider about what schedule of regular examinations is right for you. What should I know about weight and diet? Eat a Healthy Diet  Eat plenty of vegetables, fruits, whole grains, low-fat dairy products, and lean protein.  Do not eat a lot of foods high in solid fats, added sugars, or salt.  Maintain a Healthy Weight Regular exercise can help you achieve or maintain a healthy weight. You should:  Do at least 150 minutes of exercise each week. The exercise should increase your heart rate and make you sweat (moderate-intensity exercise).  Do strength-training exercises at least twice a week.  Watch Your Levels of Cholesterol and Blood Lipids  Have your blood tested for lipids and cholesterol every 5 years starting at 49 years of age. If you are at high risk for heart disease, you should start having your blood tested when you are 49 years old. You may need to have your cholesterol levels checked more often if: ? Your lipid or cholesterol levels are high. ? You are older than 49 years of age. ? You are at high risk for heart disease.  What should I know about cancer screening? Many types of cancers can be detected early and may often be prevented. Lung Cancer  You should be screened every year for lung cancer if: ? You are a current smoker who has smoked for at least 30 years. ? You are a former smoker who has quit within the past 15 years.  Talk to your health care provider about your screening options, when you should start screening, and how often you should be screened.  Colorectal Cancer  Routine colorectal cancer screening usually begins at 49 years of age and should be repeated every 5-10 years until you are 49 years old. You may need to be screened more often if early forms of precancerous  polyps or small growths are found. Your health care provider may recommend screening at an earlier age if you have risk factors for colon cancer.  Your health care provider may recommend using home test kits to check for hidden blood in the stool.  A small camera at the end of a tube can be used to examine your colon (sigmoidoscopy or colonoscopy). This checks for the earliest forms of colorectal cancer.  Prostate and Testicular Cancer  Depending on your age and overall health, your health care provider may do certain tests to screen for prostate and testicular cancer.  Talk to your health care provider about any symptoms or concerns you have about testicular or prostate cancer.  Skin Cancer  Check your skin from head to toe regularly.  Tell your health care provider about any new moles or changes in moles, especially if: ? There is a change in a mole's size, shape, or color. ? You have a mole that is larger than a pencil eraser.  Always use sunscreen. Apply sunscreen liberally and repeat throughout the day.  Protect yourself by wearing long sleeves, pants, a wide-brimmed hat, and sunglasses when outside.  What should I know about heart disease, diabetes, and high blood pressure?  If you are 91-64 years of age, have your blood pressure checked every 3-5 years. If you are 81 years of age or older, have your blood pressure checked every year. You should have your blood pressure  measured twice-once when you are at a hospital or clinic, and once when you are not at a hospital or clinic. Record the average of the two measurements. To check your blood pressure when you are not at a hospital or clinic, you can use: ? An automated blood pressure machine at a pharmacy. ? A home blood pressure monitor.  Talk to your health care provider about your target blood pressure.  If you are between 89-67 years old, ask your health care provider if you should take aspirin to prevent heart  disease.  Have regular diabetes screenings by checking your fasting blood sugar level. ? If you are at a normal weight and have a low risk for diabetes, have this test once every three years after the age of 48. ? If you are overweight and have a high risk for diabetes, consider being tested at a younger age or more often.  A one-time screening for abdominal aortic aneurysm (AAA) by ultrasound is recommended for men aged 72-75 years who are current or former smokers. What should I know about preventing infection? Hepatitis B If you have a higher risk for hepatitis B, you should be screened for this virus. Talk with your health care provider to find out if you are at risk for hepatitis B infection. Hepatitis C Blood testing is recommended for:  Everyone born from [birth year] through 1967.  Anyone with known risk factors for hepatitis C.  Sexually Transmitted Diseases (STDs)  You should be screened each year for STDs including gonorrhea and chlamydia if: ? You are sexually active and are younger than 49 years of age. ? You are older than 49 years of age and your health care provider tells you that you are at risk for this type of infection. ? Your sexual activity has changed since you were last screened and you are at an increased risk for chlamydia or gonorrhea. Ask your health care provider if you are at risk.  Talk with your health care provider about whether you are at high risk of being infected with HIV. Your health care provider may recommend a prescription medicine to help prevent HIV infection.  What else can I do?  Schedule regular health, dental, and eye exams.  Stay current with your vaccines (immunizations).  Do not use any tobacco products, such as cigarettes, chewing tobacco, and e-cigarettes. If you need help quitting, ask your health care provider.  Limit alcohol intake to no more than 2 drinks per day. One drink equals 12 ounces of beer, 5 ounces of wine, or 1 ounces of  hard liquor.  Do not use street drugs.  Do not share needles.  Ask your health care provider for help if you need support or information about quitting drugs.  Tell your health care provider if you often feel depressed.  Tell your health care provider if you have ever been abused or do not feel safe at home. This information is not intended to replace advice given to you by your health care provider. Make sure you discuss any questions you have with your health care provider. Document Released: 04/12/2008 Document Revised: 06/13/2016 Document Reviewed: 07/19/2015 Elsevier Interactive Patient Education  [employer].

## 2019-04-16 ENCOUNTER — Encounter: Payer: Self-pay | Admitting: Internal Medicine

## 2019-05-07 ENCOUNTER — Encounter: Payer: Self-pay | Admitting: Internal Medicine

## 2019-06-08 ENCOUNTER — Other Ambulatory Visit: Payer: Self-pay

## 2019-06-08 ENCOUNTER — Ambulatory Visit: Payer: Self-pay | Admitting: *Deleted

## 2019-06-08 VITALS — Ht 67.0 in | Wt 225.0 lb

## 2019-06-08 DIAGNOSIS — Z1211 Encounter for screening for malignant neoplasm of colon: Secondary | ICD-10-CM

## 2019-06-08 MED ORDER — SUPREP BOWEL PREP KIT 17.5-3.13-1.6 GM/177ML PO SOLN: 1.0000 | Freq: Once | ORAL | 0 refills | Status: AC

## 2019-06-08 NOTE — Progress Notes (Signed)
No egg or soy allergy known to patient  No issues with past sedation with any surgeries  or procedures, no past  intubation No diet pills per patient No home 02 use per patient  No blood thinners per patient  Pt denies issues with constipation  No A fib or A flutter  EMMI video sent to pt's e mail   Pt verified name, DOB, address and insurance during PV today. Pt mailed instruction packet to included paper to complete and mail back to [facility] with addressed and stamped envelope, Emmi video, copy of consent form to read and not return, and instructions. [employer] $15  coupon mailed in packet. PV completed over the phone. Pt encouraged to call with questions or issues   Pt is aware that care partner will wait in the car during procedure; if they feel like they will be too hot to wait in the car; they may wait in the lobby.  We want them to wear a mask (we do not have any that we can provide them), practice social distancing, and we will check their temperatures when they get here.  I did remind patient that their care partner needs to stay in the parking lot the entire time. Pt will wear mask into building.

## 2019-06-20 MED FILL — SUPREP BOWEL PREP KIT: 17.5-3.13-1 | 1 days supply | Qty: 354 | Fill #0

## 2019-06-22 ENCOUNTER — Encounter: Payer: 59 | Admitting: Internal Medicine

## 2019-06-24 ENCOUNTER — Encounter: Payer: Self-pay | Admitting: Internal Medicine

## 2019-06-26 ENCOUNTER — Telehealth: Payer: Self-pay | Admitting: Internal Medicine

## 2019-06-26 ENCOUNTER — Encounter: Payer: Self-pay | Admitting: Internal Medicine

## 2019-06-26 NOTE — Telephone Encounter (Signed)
Covid-19 screening questions     Do you now or have you had a fever in the last 14 days?   Do you have any respiratory symptoms of shortness of breath or cough now or in the last 14 days?   Do you have any family members or close contacts with diagnosed or suspected Covid-19 in the past 14 days?   Have you been tested for Covid-19 and found to be positive?      Please advise pt  to check in on the 4th floor and that care partner may wait in the car or come up to the lobby during procedure. Also make aware that both must enter the building wearing a mask.

## 2019-06-26 NOTE — Telephone Encounter (Signed)
Error

## 2019-06-26 NOTE — Telephone Encounter (Signed)
Pt returned call and answered "NO" to all of the Covid-19 screening questions °

## 2019-06-29 ENCOUNTER — Encounter: Payer: Self-pay | Admitting: Internal Medicine

## 2019-06-29 ENCOUNTER — Other Ambulatory Visit: Payer: Self-pay

## 2019-06-29 ENCOUNTER — Ambulatory Visit (AMBULATORY_SURGERY_CENTER): Payer: 59 | Admitting: Internal Medicine

## 2019-06-29 VITALS — BP 123/74 | HR 65 | Temp 98.2°F | Resp 17 | Ht 67.0 in | Wt 225.0 lb

## 2019-06-29 DIAGNOSIS — D120 Benign neoplasm of cecum: Secondary | ICD-10-CM | POA: Diagnosis not present

## 2019-06-29 DIAGNOSIS — D123 Benign neoplasm of transverse colon: Secondary | ICD-10-CM

## 2019-06-29 DIAGNOSIS — Z1211 Encounter for screening for malignant neoplasm of colon: Secondary | ICD-10-CM | POA: Diagnosis not present

## 2019-06-29 DIAGNOSIS — K635 Polyp of colon: Secondary | ICD-10-CM | POA: Diagnosis not present

## 2019-06-29 DIAGNOSIS — K219 Gastro-esophageal reflux disease without esophagitis: Secondary | ICD-10-CM | POA: Diagnosis not present

## 2019-06-29 MED ORDER — SODIUM CHLORIDE 0.9 % IV SOLN: 500.0000 mL | Freq: Once | INTRAVENOUS | Status: AC

## 2019-06-29 NOTE — Progress Notes (Signed)
Pt's states no medical or surgical changes since previsit or office visit. 

## 2019-06-29 NOTE — Op Note (Signed)
[facility] Patient Name: [name] [name] Procedure Date: 06/29/2019 3:43 PM MRN: [id number] Endoscopist: Jerene Bears , MD Age: 48 Referring MD:  Date of Birth: Sep 21, 1971 Gender: Male Account #: [id number] Procedure:                Colonoscopy Indications:              Screening for colorectal malignant neoplasm Medicines:                Monitored Anesthesia Care Procedure:                Pre-Anesthesia Assessment:                           - Prior to the procedure, a History and Physical                            was performed, and patient medications and                            allergies were reviewed. The patient's tolerance of                            previous anesthesia was also reviewed. The risks                            and benefits of the procedure and the sedation                            options and risks were discussed with the patient.                            All questions were answered, and informed consent                            was obtained. Prior Anticoagulants: The patient has                            taken no previous anticoagulant or antiplatelet                            agents. ASA Grade Assessment: II - A patient with                            mild systemic disease. After reviewing the risks                            and benefits, the patient was deemed in                            satisfactory condition to undergo the procedure.                           After obtaining informed consent, the colonoscope  was passed under direct vision. Throughout the                            procedure, the patient's blood pressure, pulse, and                            oxygen saturations were monitored continuously. The                            Colonoscope was introduced through the anus and                            advanced to the cecum, identified by appendiceal                            orifice and  ileocecal valve. The colonoscopy was                            performed without difficulty. The patient tolerated                            the procedure well. The quality of the bowel                            preparation was good. The ileocecal valve,                            appendiceal orifice, and rectum were photographed. Scope In: 3:54:42 PM Scope Out: 4:14:08 PM Scope Withdrawal Time: 0 hours 17 minutes 48 seconds  Total Procedure Duration: 0 hours 19 minutes 26 seconds  Findings:                 The digital rectal exam was normal.                           A 8 mm polyp was found in the cecum. The polyp was                            sessile. The polyp was removed with a cold snare.                            Resection and retrieval were complete.                           A 15 mm polyp was found in the hepatic flexure. The                            polyp was sessile. The polyp was removed with a                            cold snare. Resection and retrieval were complete.  Multiple small-mouthed diverticula were found in                            the sigmoid colon.                           Internal hemorrhoids were found during                            retroflexion. The hemorrhoids were small. Complications:            No immediate complications. Estimated Blood Loss:     Estimated blood loss was minimal. Impression:               - One 8 mm polyp in the cecum, removed with a cold                            snare. Resected and retrieved.                           - One 15 mm polyp at the hepatic flexure, removed                            with a cold snare. Resected and retrieved.                           - Diverticulosis in the sigmoid colon.                           - Small internal hemorrhoids. Recommendation:           - Patient has a contact number available for                            emergencies. The signs and symptoms of potential                             delayed complications were discussed with the                            patient. Return to normal activities tomorrow.                            Written discharge instructions were provided to the                            patient.                           - Resume previous diet.                           - Continue present medications.                           - Await pathology results.                           -  Repeat colonoscopy is recommended for                            surveillance. The colonoscopy date will be                            determined after pathology results from today's                            exam become available for review. Jerene Bears, MD 06/29/2019 4:24:00 PM This report has been signed electronically.

## 2019-06-29 NOTE — Progress Notes (Signed)
JB- Temp [place]- Vitals 

## 2019-06-29 NOTE — Progress Notes (Signed)
Report to PACU, RN, vss, BBS= Clear.  

## 2019-06-29 NOTE — Patient Instructions (Signed)
YOU HAD AN ENDOSCOPIC PROCEDURE TODAY AT [facility]:   Refer to the procedure report that was given to you for any specific questions about what was found during the examination.  If the procedure report does not answer your questions, please call your gastroenterologist to clarify.  If you requested that your care partner not be given the details of your procedure findings, then the procedure report has been included in a sealed envelope for you to review at your convenience later.  YOU SHOULD EXPECT: Some feelings of bloating in the abdomen. Passage of more gas than usual.  Walking can help get rid of the air that was put into your GI tract during the procedure and reduce the bloating. If you had a lower endoscopy (such as a colonoscopy or flexible sigmoidoscopy) you may notice spotting of blood in your stool or on the toilet paper. If you underwent a bowel prep for your procedure, you may not have a normal bowel movement for a few days.  Please Note:  You might notice some irritation and congestion in your nose or some drainage.  This is from the oxygen used during your procedure.  There is no need for concern and it should clear up in a day or so.  SYMPTOMS TO REPORT IMMEDIATELY:   Following lower endoscopy (colonoscopy or flexible sigmoidoscopy):  Excessive amounts of blood in the stool  Significant tenderness or worsening of abdominal pains  Swelling of the abdomen that is new, acute  Fever of 100F or higher    For urgent or emergent issues, a gastroenterologist can be reached at any hour by calling [phone].   DIET:  We do recommend a small meal at first, but then you may proceed to your regular diet.  Drink plenty of fluids but you should avoid alcoholic beverages for 24 hours.  ACTIVITY:  You should plan to take it easy for the rest of today and you should NOT DRIVE or use heavy machinery until tomorrow (because of the sedation medicines used during the test).     FOLLOW UP: Our staff will call the number listed on your records 48-72 hours following your procedure to check on you and address any questions or concerns that you may have regarding the information given to you following your procedure. If we do not reach you, we will leave a message.  We will attempt to reach you two times.  During this call, we will ask if you have developed any symptoms of COVID 19. If you develop any symptoms (ie: fever, flu-like symptoms, shortness of breath, cough etc.) before then, please call [phone].  If you test positive for Covid 19 in the 2 weeks post procedure, please call and report this information to [place].    If any biopsies were taken you will be contacted by phone or by letter within the next 1-3 weeks.  Please call us at [phone] if you have not heard about the biopsies in 3 weeks.    SIGNATURES/CONFIDENTIALITY: You and/or your care partner have signed paperwork which will be entered into your electronic medical record.  These signatures attest to the fact that that the information above on your After Visit Summary has been reviewed and is understood.  Full responsibility of the confidentiality of this discharge information lies with you and/or your care-partner.   Handouts were given to you on polyps, diverticulosis, and a hemorrhoids. You may resume your current medications today. Await biopsy results. Please call if  any questions or concerns.   

## 2019-07-01 ENCOUNTER — Telehealth: Payer: Self-pay

## 2019-07-01 NOTE — Telephone Encounter (Signed)
  Follow up Call-  Call back number 06/29/2019  Post procedure Call Back phone  # [phone]  Permission to leave phone message Yes  Some recent data might be hidden     Patient questions:  Do you have a fever, pain , or abdominal swelling? No. Pain Score  0 *  Have you tolerated food without any problems? Yes.    Have you been able to return to your normal activities? Yes.    Do you have any questions about your discharge instructions: Diet   No. Medications  No. Follow up visit  No.  Do you have questions or concerns about your Care? No.  Actions: * If pain score is 4 or above: 1. No action needed, pain <4.Have you developed a fever since your procedure? no  2.   Have you had an respiratory symptoms (SOB or cough) since your procedure? no  3.   Have you tested positive for COVID 19 since your procedure no  4.   Have you had any family members/close contacts diagnosed with the COVID 19 since your procedure?  no   If yes to any of these questions please route to Joylene John, RN and Alphonsa Gin, Therapist, sports.

## 2019-07-09 ENCOUNTER — Telehealth: Payer: Self-pay | Admitting: Internal Medicine

## 2019-07-09 ENCOUNTER — Encounter: Payer: Self-pay | Admitting: Internal Medicine

## 2019-07-09 NOTE — Telephone Encounter (Signed)
Path letter sent today, please let him know by phone and that I am sorry for the delay (I have been out sick)

## 2019-07-09 NOTE — Telephone Encounter (Signed)
Left message for pt to call back.  Apologized to the wife and reviewed path letter with her over the phone and she is aware.

## 2019-10-06 ENCOUNTER — Telehealth: Payer: 59 | Admitting: Nurse Practitioner

## 2019-10-06 DIAGNOSIS — L237 Allergic contact dermatitis due to plants, except food: Secondary | ICD-10-CM | POA: Diagnosis not present

## 2019-10-06 MED ORDER — PREDNISONE 20 MG PO TABS: ORAL_TABLET | ORAL | 0 refills | Status: DC

## 2019-10-06 NOTE — Progress Notes (Signed)

## 2019-10-07 MED FILL — predniSONE 20 MG TABS: 20 | 5 days supply | Qty: 10 | Fill #0

## 2019-11-23 ENCOUNTER — Ambulatory Visit: Payer: 59 | Attending: Internal Medicine

## 2019-11-23 DIAGNOSIS — Z20822 Contact with and (suspected) exposure to covid-19: Secondary | ICD-10-CM

## 2019-11-24 ENCOUNTER — Telehealth: Payer: Self-pay | Admitting: Internal Medicine

## 2019-11-24 LAB — NOVEL CORONAVIRUS, NAA: SARS-CoV-2, NAA: NOT DETECTED

## 2019-11-24 NOTE — Telephone Encounter (Signed)
Kenney Houseman  This is Dr Chase Caller. Patient [name] [name]' s wife works in Nurse, children's. Patient has covid symtpoms for 5+ days or maybe more. Today 11/24/2019 wife who works with [place] tested symptoms + and antigen testing + (drive through pending). Patient apparently has lot of risk factors and his covid test results are pending  Could you please eval for outpatient antibody infusion and give them a call  You do not need to repl;y to me  I am copying his PCP Raoul Pitch, Reinaldo Raddle, DO  Thanks  MR    SIGNATURE    Dr. Brand Males, M.D., F.C.C.P,  Pulmonary and Critical Care Medicine Staff Physician, [place] Director - Interstitial Lung Disease  Program  [facility] [place], [place], [address]  Pager: [phone], If no answer or between  15:00h - 7:00h: call [phone] Telephone: [phone]  1:16 PM 11/24/2019

## 2020-01-15 ENCOUNTER — Ambulatory Visit: Payer: 59 | Attending: Internal Medicine

## 2020-01-15 DIAGNOSIS — Z23 Encounter for immunization: Secondary | ICD-10-CM

## 2020-01-15 NOTE — Progress Notes (Signed)
   Covid-19 Vaccination Clinic  Name:  [name] [name]    MRN: [id number] DOB: 11-12-1970  01/15/2020  [name] [name] was observed post Covid-19 immunization for 15 minutes without incident. He was provided with Vaccine Information Sheet and instruction to access the V-Safe system.   [name] [name] was instructed to call 911 with any severe reactions post vaccine: Marland Kitchen Difficulty breathing  . Swelling of face and throat  . A fast heartbeat  . A bad rash all over body  . Dizziness and weakness   Immunizations Administered    Name Date Dose VIS Date Route   Pfizer COVID-19 Vaccine 01/15/2020  8:26 AM 0.3 mL 10/09/2019 Intramuscular   Manufacturer: [place]   Lot: [id number]   [place]: [id number]

## 2020-02-09 ENCOUNTER — Ambulatory Visit: Payer: 59 | Attending: Internal Medicine

## 2020-02-09 DIAGNOSIS — Z23 Encounter for immunization: Secondary | ICD-10-CM

## 2020-02-09 NOTE — Progress Notes (Signed)
   Covid-19 Vaccination Clinic  Name:  [name] [name]    MRN: [id number] DOB: Apr 17, 1971  02/09/2020  [name] [name] was observed post Covid-19 immunization for 15 minutes without incident. He was provided with Vaccine Information Sheet and instruction to access the V-Safe system.   [name] [name] was instructed to call 911 with any severe reactions post vaccine: Marland Kitchen Difficulty breathing  . Swelling of face and throat  . A fast heartbeat  . A bad rash all over body  . Dizziness and weakness   Immunizations Administered    Name Date Dose VIS Date Route   Pfizer COVID-19 Vaccine 02/09/2020  8:47 AM 0.3 mL 10/09/2019 Intramuscular   Manufacturer: [place]   Lot: [id number]   [place]: [id number]

## 2020-09-14 ENCOUNTER — Encounter: Payer: Self-pay | Admitting: Family Medicine

## 2020-09-16 ENCOUNTER — Telehealth: Payer: Self-pay

## 2020-09-16 NOTE — Telephone Encounter (Signed)
Spoke with pt and pt has already been scheduled for 09/29/20   [facility] - Client Nonclinical Telephone Record  [facility] - Client Client Site [place] - Day Physician Raoul Pitch, [place] Type Call Who Is Calling Patient / Member / Family / Caregiver Caller Name [place] Phone Number [phone] Patient Name [name] [name] Patient DOB Jul 09, 1971 Call Type Message Only Information Provided Reason for Call Request to Schedule Office Appointment Initial Comment Caller states that he wants to schedule an appointment. Disp. Time Disposition Final User 09/15/2020 1:05:01 PM General Information Provided Yes Bryson Ha Call Closed By: Bryson Ha Transaction Date/Time: 09/15/2020 1:00:36 PM (ET)

## 2020-09-29 ENCOUNTER — Encounter: Payer: Self-pay | Admitting: Family Medicine

## 2020-09-29 ENCOUNTER — Ambulatory Visit (INDEPENDENT_AMBULATORY_CARE_PROVIDER_SITE_OTHER): Payer: 59 | Admitting: Family Medicine

## 2020-09-29 ENCOUNTER — Other Ambulatory Visit: Payer: Self-pay

## 2020-09-29 VITALS — BP 139/81 | HR 75 | Temp 98.1°F | Ht 67.5 in | Wt 245.0 lb

## 2020-09-29 DIAGNOSIS — Z131 Encounter for screening for diabetes mellitus: Secondary | ICD-10-CM

## 2020-09-29 DIAGNOSIS — R6882 Decreased libido: Secondary | ICD-10-CM | POA: Diagnosis not present

## 2020-09-29 DIAGNOSIS — E669 Obesity, unspecified: Secondary | ICD-10-CM | POA: Diagnosis not present

## 2020-09-29 DIAGNOSIS — Z23 Encounter for immunization: Secondary | ICD-10-CM

## 2020-09-29 DIAGNOSIS — R0681 Apnea, not elsewhere classified: Secondary | ICD-10-CM | POA: Diagnosis not present

## 2020-09-29 DIAGNOSIS — Z0000 Encounter for general adult medical examination without abnormal findings: Secondary | ICD-10-CM

## 2020-09-29 DIAGNOSIS — K760 Fatty (change of) liver, not elsewhere classified: Secondary | ICD-10-CM

## 2020-09-29 DIAGNOSIS — E781 Pure hyperglyceridemia: Secondary | ICD-10-CM

## 2020-09-29 DIAGNOSIS — Z125 Encounter for screening for malignant neoplasm of prostate: Secondary | ICD-10-CM | POA: Diagnosis not present

## 2020-09-29 DIAGNOSIS — R0683 Snoring: Secondary | ICD-10-CM

## 2020-09-29 LAB — LIPID PANEL
Cholesterol: 210 mg/dL — ABNORMAL HIGH (ref 0–200)
HDL: 36.2 mg/dL — ABNORMAL LOW (ref >39.00)
LDL Cholesterol: 144 mg/dL — ABNORMAL HIGH (ref 0–99)
NonHDL: 173.86
Total CHOL/HDL Ratio: 6
Triglycerides: 147 mg/dL (ref 0.0–149.0)
VLDL: 29.4 mg/dL (ref 0.0–40.0)

## 2020-09-29 LAB — COMPREHENSIVE METABOLIC PANEL
ALT: 62 U/L — ABNORMAL HIGH (ref 0–53)
AST: 38 U/L — ABNORMAL HIGH (ref 0–37)
Albumin: 4.5 g/dL (ref 3.5–5.2)
Alkaline Phosphatase: 86 U/L (ref 39–117)
BUN: 10 mg/dL (ref 6–23)
CO2: 26 mEq/L (ref 19–32)
Calcium: 9.3 mg/dL (ref 8.4–10.5)
Chloride: 103 mEq/L (ref 96–112)
Creatinine, Ser: 0.87 mg/dL (ref 0.40–1.50)
GFR: 100.01 mL/min (ref >60.00)
Glucose, Bld: 105 mg/dL — ABNORMAL HIGH (ref 70–99)
Potassium: 3.9 mEq/L (ref 3.5–5.1)
Sodium: 136 mEq/L (ref 135–145)
Total Bilirubin: 1.2 mg/dL (ref 0.2–1.2)
Total Protein: 7.3 g/dL (ref 6.0–8.3)

## 2020-09-29 LAB — CBC WITH DIFFERENTIAL/PLATELET
Basophils Absolute: 0.1 10*3/uL (ref 0.0–0.1)
Basophils Relative: 1 % (ref 0.0–3.0)
Eosinophils Absolute: 0.4 10*3/uL (ref 0.0–0.7)
Eosinophils Relative: 8.4 % — ABNORMAL HIGH (ref 0.0–5.0)
HCT: 42.9 % (ref 39.0–52.0)
Hemoglobin: 14.9 g/dL (ref 13.0–17.0)
Lymphocytes Relative: 35.7 % (ref 12.0–46.0)
Lymphs Abs: 1.8 10*3/uL (ref 0.7–4.0)
MCHC: 34.7 g/dL (ref 30.0–36.0)
MCV: 96.5 fl (ref 78.0–100.0)
Monocytes Absolute: 0.3 10*3/uL (ref 0.1–1.0)
Monocytes Relative: 5.9 % (ref 3.0–12.0)
Neutro Abs: 2.5 10*3/uL (ref 1.4–7.7)
Neutrophils Relative %: 49 % (ref 43.0–77.0)
Platelets: 188 10*3/uL (ref 150.0–400.0)
RBC: 4.44 Mil/uL (ref 4.22–5.81)
RDW: 12.4 % (ref 11.5–15.5)
WBC: 5.1 10*3/uL (ref 4.0–10.5)

## 2020-09-29 LAB — TESTOSTERONE: Testosterone: 129.86 ng/dL — ABNORMAL LOW (ref 300.00–890.00)

## 2020-09-29 LAB — PSA: PSA: 0.52 ng/mL (ref 0.10–4.00)

## 2020-09-29 LAB — T4, FREE: Free T4: 0.66 ng/dL (ref 0.60–1.60)

## 2020-09-29 LAB — HEMOGLOBIN A1C: Hgb A1c MFr Bld: 5.8 % (ref 4.6–6.5)

## 2020-09-29 LAB — TSH: TSH: 1.43 u[IU]/mL (ref 0.35–4.50)

## 2020-09-29 NOTE — Patient Instructions (Addendum)
Neurology referral for sleep apnea study.  We will call you with all results  Nurse visit in 3 months for shingrix #2   If you are age 51 or older, your body mass index should be between 23-30. Your Body mass index is Body mass index is 37.81 kg/m.Marland Kitchen If this is above the aforementioned range listed, you are consider obese by medical definition and standards. Routine daily exercise and dietary modifications are encouraged. If you would like additional guidance on weight loss, please make an appointment for weight loss counseling - must be an appointment dedicate to weight loss counseling alone. I would be happy to help you.   If you are age 73 or younger, your body mass index should be between 19-25. Your Body mass index is Body mass index is 37.81 kg/m. Marland Kitchen If this is above the aformentioned range listed, you are consider obese by medical definition and standards. Routine daily exercise and dietary modifications are encouraged. If you would like additional guidance on weight loss, please make an appointment for weight loss counseling - must be an appointment dedicate to weight loss counseling alone. I would be happy to help you.    Health Maintenance, Male Adopting a healthy lifestyle and getting preventive care are important in promoting health and wellness. Ask your health care provider about:  The right schedule for you to have regular tests and exams.  Things you can do on your own to prevent diseases and keep yourself healthy. What should I know about diet, weight, and exercise? Eat a healthy diet   Eat a diet that includes plenty of vegetables, fruits, low-fat dairy products, and lean protein.  Do not eat a lot of foods that are high in solid fats, added sugars, or sodium. Maintain a healthy weight Body mass index (BMI) is a measurement that can be used to identify possible weight problems. It estimates body fat based on height and weight. Your health care provider can help determine  your BMI and help you achieve or maintain a healthy weight. Get regular exercise Get regular exercise. This is one of the most important things you can do for your health. Most adults should:  Exercise for at least 150 minutes each week. The exercise should increase your heart rate and make you sweat (moderate-intensity exercise).  Do strengthening exercises at least twice a week. This is in addition to the moderate-intensity exercise.  Spend less time sitting. Even light physical activity can be beneficial. Watch cholesterol and blood lipids Have your blood tested for lipids and cholesterol at 51 years of age, then have this test every 5 years. You may need to have your cholesterol levels checked more often if:  Your lipid or cholesterol levels are high.  You are older than 51 years of age.  You are at high risk for heart disease. What should I know about cancer screening? Many types of cancers can be detected early and may often be prevented. Depending on your health history and family history, you may need to have cancer screening at various ages. This may include screening for:  Colorectal cancer.  Prostate cancer.  Skin cancer.  Lung cancer. What should I know about heart disease, diabetes, and high blood pressure? Blood pressure and heart disease  High blood pressure causes heart disease and increases the risk of stroke. This is more likely to develop in people who have high blood pressure readings, are of African descent, or are overweight.  Talk with your health care provider  about your target blood pressure readings.  Have your blood pressure checked: ? Every 3-5 years if you are 65-35 years of age. ? Every year if you are 63 years old or older.  If you are between the ages of 29 and 44 and are a current or former smoker, ask your health care provider if you should have a one-time screening for abdominal aortic aneurysm (AAA). Diabetes Have regular diabetes  screenings. This checks your fasting blood sugar level. Have the screening done:  Once every three years after age 60 if you are at a normal weight and have a low risk for diabetes.  More often and at a younger age if you are overweight or have a high risk for diabetes. What should I know about preventing infection? Hepatitis B If you have a higher risk for hepatitis B, you should be screened for this virus. Talk with your health care provider to find out if you are at risk for hepatitis B infection. Hepatitis C Blood testing is recommended for:  Everyone born from [birth year] through 1967.  Anyone with known risk factors for hepatitis C. Sexually transmitted infections (STIs)  You should be screened each year for STIs, including gonorrhea and chlamydia, if: ? You are sexually active and are younger than 51 years of age. ? You are older than 51 years of age and your health care provider tells you that you are at risk for this type of infection. ? Your sexual activity has changed since you were last screened, and you are at increased risk for chlamydia or gonorrhea. Ask your health care provider if you are at risk.  Ask your health care provider about whether you are at high risk for HIV. Your health care provider may recommend a prescription medicine to help prevent HIV infection. If you choose to take medicine to prevent HIV, you should first get tested for HIV. You should then be tested every 3 months for as long as you are taking the medicine. Follow these instructions at home: Lifestyle  Do not use any products that contain nicotine or tobacco, such as cigarettes, e-cigarettes, and chewing tobacco. If you need help quitting, ask your health care provider.  Do not use street drugs.  Do not share needles.  Ask your health care provider for help if you need support or information about quitting drugs. Alcohol use  Do not drink alcohol if your health care provider tells you not to  drink.  If you drink alcohol: ? Limit how much you have to 0-2 drinks a day. ? Be aware of how much alcohol is in your drink. In the [place] one drink equals one 12 oz bottle of beer (355 mL), one 5 oz glass of wine (148 mL), or one 1 oz glass of hard liquor (44 mL). General instructions  Schedule regular health, dental, and eye exams.  Stay current with your vaccines.  Tell your health care provider if: ? You often feel depressed. ? You have ever been abused or do not feel safe at home. Summary  Adopting a healthy lifestyle and getting preventive care are important in promoting health and wellness.  Follow your health care provider's instructions about healthy diet, exercising, and getting tested or screened for diseases.  Follow your health care provider's instructions on monitoring your cholesterol and blood pressure. This information is not intended to replace advice given to you by your health care provider. Make sure you discuss any questions you have with your health care  provider. Document Revised: 10/08/2018 Document Reviewed: 10/08/2018 Elsevier Patient Education  2020 [employer].

## 2020-09-29 NOTE — Progress Notes (Signed)
This visit occurred during the SARS-CoV-2 public health emergency.  Safety protocols were in place, including screening questions prior to the visit, additional usage of staff PPE, and extensive cleaning of exam room while observing appropriate contact time as indicated for disinfecting solutions.    Patient ID: [name] [name], male  DOB: [birth year], 49 y.o.   MRN: [id number] Patient Care Team    Relationship Specialty Notifications Start End  Ma Hillock, DO PCP - General Family Medicine  06/25/16   Jerene Bears, MD Consulting Physician Gastroenterology  09/29/20     Chief Complaint  Patient presents with  . Annual Exam    pt is fasting;     Subjective:  [name] [name] is a 49 y.o.  male  present for CPE. All past medical history, surgical history, allergies, family history, immunizations, medications and social history were updated in the electronic medical record today. All recent labs, ED visits and hospitalizations within the last year were reviewed.  Health maintenance:  Colonoscopy: completed 06/29/2019- 3 yr, Dr. Hilarie Fredrickson.  Due 2023 Immunizations: tdapUTD 06/25/2016, Influenza111/2021 UTD(encouraged yearly), Pfizer covid series and booster completed, Shingrix No. 1 provided today-nurse visit in 3 months for Shingrix No. 2 administration Infectious disease screening:HIV completed 07/03/2016, Hep C completed PSA> collected today Assistive device: none Oxygen ZYS:AYTK Patient has a Dental home. Hospitalizations/ED visits: reviewed  Snoring: Patient reports he has been told by his wife he is snoring more.  She also feels he has had moments where he has lost his breath in the middle the night.  He does have daytime fatigue which he attributed to his work schedule.  His BMI is over 30 and he is 49 years old.  Libido: Patient reports he has had decreased libido for a few years.  He states when he comes home from work he is very tired and he typically just wants to go  to bed.  This however is becoming a problem within his marriage and he is wondering if there is anything he can possibly do about it or medication he could possibly take.  Depression screen [facility] 2/9 09/29/2020 09/03/2018 09/25/2017 06/25/2016  Decreased Interest 0 0 0 0  Down, Depressed, Hopeless 0 0 0 0  PHQ - 2 Score 0 0 0 0   No flowsheet data found.  Immunization History  Administered Date(s) Administered  . Influenza,inj,Quad PF,6+ Mos 07/30/2018, 07/09/2019  . Influenza-Unspecified 07/30/2015, 08/04/2016, 08/14/2017, 09/16/2020  . PFIZER SARS-COV-2 Vaccination 01/15/2020, 02/09/2020, 09/17/2020  . Tdap 06/25/2016  . Zoster Recombinat (Shingrix) 09/29/2020   Past Medical History:  Diagnosis Date  . Fatty liver   . GERD (gastroesophageal reflux disease)    uses Tums- helps at times   . Hyperlipidemia   . IBS (irritable bowel syndrome)   . Kidney stone   . Obesity    No Known Allergies Past Surgical History:  Procedure Laterality Date  . COLONOSCOPY  08/06/2005  . NO PAST SURGERIES     Family History  Problem Relation Age of Onset  . Cirrhosis Paternal Grandfather   . Colon cancer Neg Hx   . Colon polyps Neg Hx   . Esophageal cancer Neg Hx   . Rectal cancer Neg Hx   . Stomach cancer Neg Hx    Social History   Social History Narrative   Married to [place]. Has 3 children Cornelius Moras and [employer] school education. Works as a Theme park manager.   Drinks caffeine, uses herbal remedies.   Wears  his seatbelt, smoke detectors in the house.   No firearms in the house.   Feels safe in his relationships.    Allergies as of 09/29/2020   No Known Allergies     Medication List       Accurate as of September 29, 2020 11:59 PM. If you have any questions, ask your nurse or doctor.        STOP taking these medications   ibuprofen 800 MG tablet Commonly known as: ADVIL Stopped by: Howard Pouch, DO   predniSONE 20 MG tablet Commonly known as: Deltasone Stopped by:  Howard Pouch, DO     TAKE these medications   calcium carbonate 500 MG chewable tablet Commonly known as: TUMS - dosed in mg elemental calcium Chew 1 tablet by mouth daily.   esomeprazole 20 MG capsule Commonly known as: NEXIUM Take 1 capsule (20 mg total) by mouth daily at 12 noon.   Fish Oil 1000 MG Caps Take by mouth.       All past medical history, surgical history, allergies, family history, immunizations andmedications were updated in the EMR today and reviewed under the history and medication portions of their EMR.     ROS: 14 pt review of systems performed and negative (unless mentioned in an HPI)  Objective: BP 139/81   Pulse 75   Temp 98.1 F (36.7 C) (Oral)   Ht 5' 7.5" (1.715 m)   Wt 245 lb (111.1 kg)   SpO2 97%   BMI 37.81 kg/m  Gen: Afebrile. No acute distress. Nontoxic in appearance, well-developed, well-nourished, obese male HENT: AT. [place]. Bilateral TM visualized and normal in appearance, normal external auditory canal. MMM, no oral lesions, adequate dentition. Bilateral nares within normal limits. Throat without erythema, ulcerations or exudates.  No cough on exam, no hoarseness on exam. Eyes:Pupils Equal Round Reactive to light, Extraocular movements intact,  Conjunctiva without redness, discharge or icterus. Neck/lymp/endocrine: Supple, no lymphadenopathy, no thyromegaly CV: RRR no murmur, no edema, +2/4 P posterior tibialis pulses.  Chest: CTAB, no wheeze, rhonchi or crackles.  Normal respiratory effort.  Good air movement. Abd: Soft.  Flat. NTND. BS present.  No masses palpated. No hepatosplenomegaly. No rebound tenderness or guarding. Skin: No rashes, purpura or petechiae. Warm and well-perfused. Skin intact. Neuro/Msk:  Normal gait. PERLA. EOMi. Alert. Oriented x3.  Cranial nerves II through XII intact. Muscle strength 5/5 upper/lower extremity. DTRs equal bilaterally. Psych: Normal affect, dress and demeanor. Normal speech. Normal thought content and  judgment.   No exam data present  Assessment/plan: [name] [name] is a 49 y.o. male present for CPE Hepatic steatosis/Obesity (BMI 30-39.9) Diet and exercise encouraged - CBC with Differential/Platelet - Comprehensive metabolic panel - Lipid panel  Decreased libido Patient reports rather decreased libido which is affecting his marriage.  We discussed potential causes for decreased libido and he would like to have his testosterone and thyroid checked today. - TSH - T4, free - Testosterone  Apneic spell/snoring Patient reports his wife has concerns over sleep apnea.  She reports his snoring has increased and she has witnessed what she believes to be apneic spells.  Patient is at higher risk per screening criteria with age and BMI alone. - CBC with Differential/Platelet -Referral to [facility] for evaluation Hypertriglyceridemia - CBC with Differential/Platelet - Comprehensive metabolic panel - Lipid panel - TSH - T4, free Prostate cancer screening - PSA Diabetes mellitus screening - Hemoglobin A1c Encounter for preventive health examination Patient was encouraged to exercise greater  than 150 minutes a week. Patient was encouraged to choose a diet filled with fresh fruits and vegetables, and lean meats. AVS provided to patient today for education/recommendation on gender specific health and safety maintenance. Colonoscopy: completed 06/29/2019- 3 yr, Dr. Hilarie Fredrickson.  Due 2023 Immunizations: tdapUTD 06/25/2016, Influenza111/2021 UTD(encouraged yearly), Pfizer covid series and booster completed, Shingrix No. 1 provided today-nurse visit in 3 months for Shingrix No. 2 administration Infectious disease screening:HIV completed 07/03/2016, Hep C completed PSA> collected today  Return in about 1 year (around 09/29/2021) for CPE (30 min).  Orders Placed This Encounter  Procedures  . Varicella-zoster vaccine IM  . CBC with Differential/Platelet  . Comprehensive  metabolic panel  . Hemoglobin A1c  . Lipid panel  . PSA  . TSH  . T4, free  . Testosterone  . Ambulatory referral to Neurology    No orders of the defined types were placed in this encounter.   Referral Orders     Ambulatory referral to Neurology   Electronically signed by: Howard Pouch, DO [place]

## 2020-09-30 ENCOUNTER — Telehealth: Payer: Self-pay | Admitting: Family Medicine

## 2020-09-30 NOTE — Telephone Encounter (Signed)
LM for pt to return call to discuss.  

## 2020-09-30 NOTE — Telephone Encounter (Signed)
LM for pt to return call regarding results.  

## 2020-09-30 NOTE — Telephone Encounter (Signed)
Please call patient -Liver, kidney and thyroid function are normal -Blood cell counts and electrolytes are normal -Diabetes screening/A1c is normal at 5.8 -Prostate cancer screening in normal range -Cholesterol panel looks okay.  Any higher and we would need to consider adding a cholesterol-lowering medication.  I would recommend increasing exercise regimen and fiber in his diet.  Decreasing fried foods/greasy, butter, red meats and sweets.  Switch to olive oil or canola oil only, when using oils.  Lastly, his testosterone levels are significantly low at 130.  Appropriate range for his age is around 300-400.  Low testosterone can cause the symptoms he was describing.  In order to prove that he has low testosterone and a requirement for insurance/diagnosis is that we have to repeat the testosterone levels 1 additional time to ensure they are low before attempting to start testosterone.  Please schedule him for a provider visit in 2-4 weeks-fasting if able, so that we can retest his testosterone and discuss the details of testosterone supplementation and potential side effects. This appointment must occur between 8:00-9:00 a.m.  (for testing accuracy).

## 2020-09-30 NOTE — Telephone Encounter (Signed)
Spoke with pt wife regarding labs and instructions.   

## 2020-10-27 ENCOUNTER — Ambulatory Visit: Payer: 59 | Admitting: Family Medicine

## 2020-11-03 ENCOUNTER — Other Ambulatory Visit: Payer: Self-pay

## 2020-11-03 ENCOUNTER — Encounter: Payer: Self-pay | Admitting: Family Medicine

## 2020-11-03 ENCOUNTER — Ambulatory Visit: Payer: 59 | Admitting: Family Medicine

## 2020-11-03 VITALS — BP 129/84 | HR 60 | Temp 98.3°F | Ht 67.5 in | Wt 246.0 lb

## 2020-11-03 DIAGNOSIS — R6882 Decreased libido: Secondary | ICD-10-CM

## 2020-11-03 DIAGNOSIS — R7989 Other specified abnormal findings of blood chemistry: Secondary | ICD-10-CM

## 2020-11-03 LAB — TESTOSTERONE: Testosterone: 193.04 ng/dL — ABNORMAL LOW (ref 300.00–890.00)

## 2020-11-03 NOTE — Progress Notes (Signed)
This visit occurred during the SARS-CoV-2 public health emergency.  Safety protocols were in place, including screening questions prior to the visit, additional usage of staff PPE, and extensive cleaning of exam room while observing appropriate contact time as indicated for disinfecting solutions.    [name] [name] , [birth year], 50 y.o., male MRN: [id number] Patient Care Team    Relationship Specialty Notifications Start End  Ma Hillock, DO PCP - General Family Medicine  06/25/16   Pyrtle, [name] Lines, MD Consulting Physician Gastroenterology  09/29/20     Chief Complaint  Patient presents with  . Follow-up    Pt is fasting     Subjective: Pt presents for an OV to follow up on his decreased libido and low T. Work up initiated at his CPE 1 month ago (see original note below). His testosterone level returned 128.86 collected prior to 10am. He is present today to discuss testosterone supplement protocol, side effects, contraindications and follow ups, along with verification testing.Patient denies personal or family history of prostate cancer, male breast cancer, severe sleep apnea, severe BPH or severe chronic cardiac failure.  CMP: 09/29/2020> WNL w/exception of chronic elevation of LFT (fatty liver) CBC: 09/29/2020> WNL Hg 14.9/HCT 42.9,  Testosterone panel : 128.86> collected today PSA: 09/29/2020 0.52 Lipids: borderline> discussed statin, every 6 months Testosterone dose: TBD   Prior note:  Libido: Patient reports he has had decreased libido for a few years.  He states when he comes home from work he is very tired and he typically just wants to go to bed.  This however is becoming a problem within his marriage and he is wondering if there is anything he can possibly do about it or medication he could possibly take.  Depression screen [facility] 2/9 09/29/2020 09/03/2018 09/25/2017 06/25/2016  Decreased Interest 0 0 0 0  Down, Depressed, Hopeless 0 0 0 0  PHQ - 2 Score 0 0 0 0    No  Known Allergies Social History   Social History Narrative   Married to [place]. Has 3 children [name] [name] and [name] [name] school education. Works as a Theme park manager.   Drinks caffeine, uses herbal remedies.   Wears his seatbelt, smoke detectors in the house.   No firearms in the house.   Feels safe in his relationships.   Past Medical History:  Diagnosis Date  . Fatty liver   . GERD (gastroesophageal reflux disease)    uses Tums- helps at times   . Hyperlipidemia   . IBS (irritable bowel syndrome)   . Kidney stone   . Obesity    Past Surgical History:  Procedure Laterality Date  . COLONOSCOPY  08/06/2005  . NO PAST SURGERIES     Family History  Problem Relation Age of Onset  . Cirrhosis Paternal Grandfather   . Colon cancer Neg Hx   . Colon polyps Neg Hx   . Esophageal cancer Neg Hx   . Rectal cancer Neg Hx   . Stomach cancer Neg Hx    Allergies as of 11/03/2020   No Known Allergies     Medication List       Accurate as of November 03, 2020  8:40 AM. If you have any questions, ask your nurse or doctor.        calcium carbonate 500 MG chewable tablet Commonly known as: TUMS - dosed in mg elemental calcium Chew 1 tablet by mouth daily.   esomeprazole 20 MG capsule Commonly known as:  NEXIUM Take 1 capsule (20 mg total) by mouth daily at 12 noon.   Fish Oil 1000 MG Caps Take by mouth.       All past medical history, surgical history, allergies, family history, immunizations andmedications were updated in the EMR today and reviewed under the history and medication portions of their EMR.     ROS: Negative, with the exception of above mentioned in HPI   Objective:  BP 129/84   Pulse 60   Temp 98.3 F (36.8 C) (Oral)   Ht 5' 7.5" (1.715 m)   Wt 246 lb (111.6 kg)   SpO2 98%   BMI 37.96 kg/m  Body mass index is 37.96 kg/m. Gen: Afebrile. No acute distress. Nontoxic in appearance, well developed, well nourished. Obese male.  HENT: AT. [place].  Eyes:Pupils  Equal Round Reactive to light, Extraocular movements intact,  Conjunctiva without redness, discharge or icterus. CV: RRR no murmur, no edema Chest: CTAB, no wheeze or crackles. Good air movement, normal resp effort.  Neuro: Normal gait. PERLA. EOMi. Alert. Oriented x3 Psych: Normal affect, dress and demeanor. Normal speech. Normal thought content and judgment.  No exam data present No results found. No results found for this or any previous visit (from the past 24 hour(s)).  Assessment/Plan: [name] [name] is a 50 y.o. male present for OV for  Decreased libido/low testosterone Patient reports rather decreased libido which is affecting his marriage and fatigue.  Lengthy discussion today surrounding testosterone supplementation.  Retest today for verification of low T/hypogonadism.  - pt was provided with protocol for testosterone IM INJ. He will speak it over with his wife tonight if he would want injection format every 14 days vs endocrine referral if desiring pother format of supplement- assuming he meets criteria after labs today.  He is aware he will need to be seen every 3 mos and there will need to be frequent lab testing.   - PSA/cbc/testoterone levels prior to start of medication, then repeat after 3, 6, 12 months of use. Long term monitoring every 6 months while on medication (except - PSA annually). - If he meets criteria nad they elect to start testosterone:>Schedule 5 injections every other week/nurse visit. Then labs to occur 1 week before 6th injection at 8 am. Follow up with provider/doctor appointment 1 week after lab, on the day  injection would be needed.  - TSH> normal - T4, free> normal - Testosterone> 129.86 (09/29/2020)> repeated today - goal if supplement: ~400     Reviewed expectations re: course of current medical issues.  Discussed self-management of symptoms.  Outlined signs and symptoms indicating need for more acute intervention.  Patient verbalized  understanding and all questions were answered.  Patient received an After-Visit Summary.    Orders Placed This Encounter  Procedures  . Testosterone   No orders of the defined types were placed in this encounter.  Referral Orders  No referral(s) requested today    > 25 Minutes was dedicated to this patient's encounter to include pre-visit review of chart, face-to-face time with patient and post-visit work- which include documentation and prescribing medications and/or ordering test when necessary.    Note is dictated utilizing voice recognition software. Although note has been proof read prior to signing, occasional typographical errors still can be missed. If any questions arise, please do not hesitate to call for verification.   electronically signed by:  Felix Pacini, DO  [facility] [place]

## 2020-11-03 NOTE — Patient Instructions (Addendum)
Supplementing testosterone: If your testosterone levels are found to be low and replacement therapy is indicated, you will need PSA/testoterone levels prior to start of medication, after 3 months of use and then again every 6 months while on medication.  Testosterone levels must be collected between 8-10 am in the morning (earlier the better). You will need to have testosterone injections completed in the office every 14 days. This is completed to ensure accurate dosing/timing of medication. Testosterone is a controlled substance and therefore is regulated strictly.  Testosterone goal: ~400  - cbc, lipids, PSA/cbc/testoterone levels prior to start of medication, then repeat after 3, 6, 12 months of use. Long term monitoring every 6 months while on medication (except - PSA annually).  Noncompliance to this regimen will result in discontinuation of medication.    If you meet criteria for testosterone supplement after labs today and you decide you do not want injections- then we could refer you to endocrine to consider other formats of supplement.   Testosterone Replacement Therapy  Testosterone replacement therapy (TRT) is used to treat men who have a low testosterone level (hypogonadism). Testosterone is a male hormone that is produced in the testicles. It is responsible for typically male characteristics and for maintaining a man's sex drive and the ability to get an erection. Testosterone also supports bone and muscle health. TRT can be a gel, liquid, or patch that you put on your skin. It can also be in the form of a tablet or an injection. In some cases, your health care provider may insert long-acting pellets under your skin. In most men, the level of testosterone starts to decline gradually after age 62. Low testosterone can also be caused by certain medical conditions, medicines, and obesity. Your health care provider can diagnose hypogonadism with at least two blood tests that are done early  in the morning. Low testosterone may not need to be treated. TRT is usually a choice that you make with your health care provider. Your health care provider may recommend TRT if you have low testosterone that is causing symptoms, such as:  Low sex drive.  Erection problems.  Breast enlargement.  Loss of body hair.  Weak muscles or bones.  Shrinking testicles.  Increased body fat.  Low energy.  Hot flashes.  Depression.  Decreased work Systems analyst. TRT is a lifetime treatment. If you stop treatment, your testosterone will drop, and your symptoms may return. What are the risks? Testosterone replacement therapy may have side effects, including:  Lower sperm count.  Skin irritation at the application or injection site.  Mouth irritation if you take an oral tablet.  Acne.  Swelling of your legs or feet.  Tender breasts.  Dizziness.  Sleep disturbance.  Mood swings.  Possible increased risk of stroke or heart attack. Testosterone replacement therapy may also increase your risk for prostate cancer or male breast cancer. You should not use TRT if you have either of those conditions. Your health care provider also may not recommend TRT if:  You are suspected of having prostate cancer.  You want to father a child.  You have a high number of red blood cells.  You have untreated sleep apnea.  You have a very large prostate. Supplies needed:  Your health care provider will prescribe the testosterone gel, solution, or medicine that you need. If your health care provider teaches you to do self-injections at home, you will also need: ? Your medicine vial. ? Disposable needles and syringes. ? Alcohol swabs. ?  A needle disposal container. ? Adhesive bandages. How to use testosterone replacement therapy Your health care provider will help you find the TRT option that will work best for you based on your preference, the side effects, and the cost. You may:  Rub  testosterone gel on your upper arm or shoulder every day after a shower. This is the most common type of TRT. Do not let women or children come in contact with the gel.  Apply a testosterone solution under your arms once each day.  Place a testosterone patch on your skin once each day.  Dissolve a testosterone tablet in your mouth twice each day.  Have a testosterone pellet inserted under your skin by your health care provider. This will be replaced every 3-6 months.  Use testosterone nasal spray three times each day.  Get testosterone injections. For some types of testosterone, your health care provider will give you this injection. With other types of testosterone, you may be taught to give injections to yourself. The frequency of injections may vary based on the type of testosterone that you receive. Follow these instructions at home:  Take over-the-counter and prescription medicines only as told by your health care provider.  Lose weight if you are overweight. Ask your health care provider to help you start a healthy diet and exercise program to reach and maintain a healthy weight.  Work with your health care provider to treat other medical conditions that may lower your testosterone. These include obesity, high blood pressure, high cholesterol, diabetes, liver disease, kidney disease, and sleep apnea.  Keep all follow-up visits as told by your health care provider. This is important. General recommendations  Discuss all risks and benefits with your health care provider before starting therapy.  Work with your health care provider to check your prostate health and do blood testing before you start therapy.  Do not use any testosterone replacement therapies that are not prescribed by your health care provider or not approved for use in the [place]  Do not use TRT for bodybuilding or to improve sexual performance. TRT should be used only to treat symptoms of low testosterone.  Return  for all repeat prostate checks and blood tests during therapy, as told by your health care provider. Where to find more information Learn more about testosterone replacement therapy from:  American Urological Foundation: www.urologyhealth.org/urologic-conditions/low-testosterone-(hypogonadism)  Endocrine Society: www.hormone.org/diseases-and-conditions/mens-health/hypogonadism Contact a health care provider if:  You have side effects from your testosterone replacement therapy.  You continue to have symptoms of low testosterone during treatment.  You develop new symptoms during treatment. Summary  Testosterone replacement therapy is only for men who have low testosterone as determined by blood testing and who have symptoms of low testosterone.  Testosterone replacement therapy should be prescribed only by a health care provider and should be used under the supervision of a health care provider.  You may not be able to take testosterone if you have certain medical conditions, including prostate cancer, male breast cancer, or heart disease.  Testosterone replacement therapy may have side effects and may make some medical conditions worse.  Talk with your health care provider about all the risks and benefits before you start therapy. This information is not intended to replace advice given to you by your health care provider. Make sure you discuss any questions you have with your health care provider. Document Revised: 10/28/2016 Document Reviewed: 07/05/2016 Elsevier Patient Education  2020 [employer].

## 2020-11-07 ENCOUNTER — Telehealth: Payer: Self-pay

## 2020-11-07 DIAGNOSIS — R6882 Decreased libido: Secondary | ICD-10-CM

## 2020-11-07 DIAGNOSIS — R7989 Other specified abnormal findings of blood chemistry: Secondary | ICD-10-CM

## 2020-11-07 NOTE — Telephone Encounter (Signed)
Spoke with pt regarding labs and instructions. Pt would like Endo referral. Referral placed.

## 2020-11-07 NOTE — Telephone Encounter (Signed)
-----   Message from Ma Hillock, DO sent at 11/04/2020 11:50 AM EST ----- Please inform patient his testosterone levels are low.  We discussed testosterone supplement at his appt and he was talk over options with his wife. If he would like to start injections - Monday I will call in med and we set him up with a nurse appt to start.  If they would like an endocrine referral for other options please place for them.  Please advise of their decision.

## 2020-11-10 ENCOUNTER — Ambulatory Visit: Payer: 59 | Admitting: Neurology

## 2020-11-10 ENCOUNTER — Encounter: Payer: Self-pay | Admitting: Neurology

## 2020-11-10 VITALS — BP 146/88 | HR 56 | Ht 67.0 in | Wt 247.0 lb

## 2020-11-10 DIAGNOSIS — R0681 Apnea, not elsewhere classified: Secondary | ICD-10-CM | POA: Diagnosis not present

## 2020-11-10 DIAGNOSIS — R0683 Snoring: Secondary | ICD-10-CM | POA: Diagnosis not present

## 2020-11-10 DIAGNOSIS — R519 Headache, unspecified: Secondary | ICD-10-CM

## 2020-11-10 DIAGNOSIS — E669 Obesity, unspecified: Secondary | ICD-10-CM | POA: Diagnosis not present

## 2020-11-10 DIAGNOSIS — Z9189 Other specified personal risk factors, not elsewhere classified: Secondary | ICD-10-CM

## 2020-11-10 NOTE — Progress Notes (Signed)
Subjective:    Patient ID: [name] [name] is a 50 y.o. male.  HPI     Star Age, MD, PhD [facility] [address] [place], [place] [address]  Dear Dr. Raoul Pitch,   I saw your patient, [name] [name], upon your kind request, in my Sleep clinic today for initial consultation of his sleep disorder, in particular, concern for underlying obstructive sleep apnea.  The patient is accompanied by his wife today.  As you know, [name] is a 50 year old right-handed gentleman with an underlying medical history of hyperlipidemia, irritable bowel syndrome, kidney stones, reflux disease, fatty liver, and obesity, who reports snoring and excessive daytime somnolence as well as witnessed apneas per wife's report.  I reviewed your office note from 09/29/2020.  His Epworth sleepiness score is 2 out of 24, fatigue severity score is 30 out of 63.  He works as a Theme park manager.  He has not had any sleepiness at work or at the wheel.  Wife reports that in the past 1 to 2 years his snoring has become worse and he has witnessed pauses while asleep, particularly when he sleeps supine.  He still snores fairly loudly and every sleep position including side sleeping and stomach sleeping.  He goes to bed around 10 and rise time is generally between 5 and 5:30 AM.  He has had the occasional morning headache but denies night to night nocturia.  He has gained approximately 20 pounds in the past 2 years.  He is working on weight loss currently.  He is a non-smoker and does not utilize any alcohol currently and drinks caffeine in the form of coffee, up to 1 cup twice daily.  He lives with his wife and 3 children, ages 22, 41 and 31.  He does not watch TV in his bedroom.  They have 1 dog in the household and she does not sleep in the bedroom with them.  He does have a history of bruxism and had a dentist made occlusive guard and now he uses an over-the-counter one for the top teeth.  He is  not aware of any family history of sleep apnea.  His Past Medical History Is Significant For: Past Medical History:  Diagnosis Date  . Fatty liver   . Fatty liver   . GERD (gastroesophageal reflux disease)    uses Tums- helps at times   . Hyperlipidemia   . IBS (irritable bowel syndrome)   . Kidney stone   . Kidney stones   . Low testosterone   . Obesity   . Snoring     His Past Surgical History Is Significant For: Past Surgical History:  Procedure Laterality Date  . COLONOSCOPY  08/06/2005  . NO PAST SURGERIES      His Family History Is Significant For: Family History  Problem Relation Age of Onset  . Cirrhosis Paternal Grandfather   . Colon cancer Neg Hx   . Colon polyps Neg Hx   . Esophageal cancer Neg Hx   . Rectal cancer Neg Hx   . Stomach cancer Neg Hx     His Social History Is Significant For: Social History   Socioeconomic History  . Marital status: Married    Spouse name: Anderson Malta  . Number of children: 3  . Years of education: Not on file  . Highest education level: High school graduate  Occupational History  . Occupation: roofer  Tobacco Use  . Smoking status: Never Smoker  . Smokeless  tobacco: Never Used  Vaping Use  . Vaping Use: Never used  Substance and Sexual Activity  . Alcohol use: Yes    Alcohol/week: 0.0 standard drinks    Comment: occ  . Drug use: No  . Sexual activity: Yes    Partners: Female    Birth control/protection: None    Comment: Married  Other Topics Concern  . Not on file  Social History Narrative   Married to [place]. Has 3 children Cornelius Moras and [employer] school education. Works as a Theme park manager.   Drinks caffeine, uses herbal remedies.   Wears his seatbelt, smoke detectors in the house.   No firearms in the house.   Feels safe in his relationships.   Social Determinants of Health   Financial Resource Strain: Not on file  Food Insecurity: Not on file  Transportation Needs: Not on file  Physical  Activity: Not on file  Stress: Not on file  Social Connections: Not on file    His Allergies Are:  No Known Allergies:   His Current Medications Are:  Outpatient Encounter Medications as of 11/10/2020  Medication Sig  . Multiple Vitamin (MULTIVITAMIN ADULT PO) Take 1 tablet by mouth daily. men's  . Omega-3 Fatty Acids (FISH OIL) 1000 MG CAPS Take by mouth.  . calcium carbonate (TUMS - DOSED IN MG ELEMENTAL CALCIUM) 500 MG chewable tablet Chew 1 tablet by mouth daily. (Patient not taking: Reported on 11/10/2020)  . esomeprazole (NEXIUM) 20 MG capsule Take 1 capsule (20 mg total) by mouth daily at 12 noon. (Patient not taking: Reported on 11/10/2020)   Facility-Administered Encounter Medications as of 11/10/2020  Medication  . 0.9 %  sodium chloride infusion  :   Review of Systems:  Out of a complete 14 point review of systems, all are reviewed and negative with the exception of these symptoms as listed below: Review of Systems  Neurological:       Rm 2 new pt here with wife for snoring, apneic spell  Epworth Sleepiness Scale 0= would never doze 1= slight chance of dozing 2= moderate chance of dozing 3= high chance of dozing  Sitting and reading:0 Watching TV:1 Sitting inactive in a public place (ex. Theater or meeting):0 As a passenger in a car for an hour without a break:0 Lying down to rest in the afternoon:1 Sitting and talking to someone:0 Sitting quietly after lunch (no alcohol):0 In a car, while stopped in traffic:0 Total:2    Objective:  Neurological Exam  Physical Exam Physical Examination:   Vitals:   11/10/20 1058  BP: (!) 146/88  Pulse: (!) 56    General Examination: The patient is a very pleasant 50 y.o. male in no acute distress. He appears well-developed and well-nourished and well groomed.   HEENT: Normocephalic, atraumatic, pupils are equal, round and reactive to light, extraocular tracking is good without limitation to gaze excursion or nystagmus  noted. Hearing is grossly intact. Face is symmetric with normal facial animation. Speech is clear with no dysarthria noted. There is no hypophonia. There is no lip, neck/head, jaw or voice tremor. Neck is supple with full range of passive and active motion. There are no carotid bruits on auscultation. Oropharynx exam reveals: Mild mouth dryness, adequate dental hygiene, moderate airway crowding secondary to small airway entry, thicker soft palate, tonsillar size of about 1+, slightly prominent uvula, Mallampati class III.  Neck circumference of 18-1/2 inches.  He does not have an overbite, slight skewed teeth alignment and evidence  of tooth grinding.  Of note, he has been using an over-the-counter occlusive guard on the top.  Tongue protrudes centrally and palate elevates symmetrically.  Chest: Clear to auscultation without wheezing, rhonchi or crackles noted.  Heart: S1+S2+0, regular and normal without murmurs, rubs or gallops noted.   Abdomen: Soft, non-tender and non-distended with normal bowel sounds appreciated on auscultation.  Extremities: There is no pitting edema in the distal lower extremities bilaterally.   Skin: Warm and dry without trophic changes noted.   Musculoskeletal: exam reveals no obvious joint deformities, tenderness or joint swelling or erythema.   Neurologically:  Mental status: The patient is awake, alert and oriented in all 4 spheres. His immediate and remote memory, attention, language skills and fund of knowledge are appropriate. There is no evidence of aphasia, agnosia, apraxia or anomia. Speech is clear with normal prosody and enunciation. Thought process is linear. Mood is normal and affect is normal.  Cranial nerves II - XII are as described above under HEENT exam.  Motor exam: Normal bulk, strength and tone is noted. There is no tremor, Romberg is negative. Fine motor skills and coordination: grossly intact.  Cerebellar testing: No dysmetria or intention tremor.  There is no truncal or gait ataxia.  Sensory exam: intact to light touch in the upper and lower extremities.  Gait, station and balance: He stands easily. No veering to one side is noted. No leaning to one side is noted. Posture is age-appropriate and stance is narrow based. Gait shows normal stride length and normal pace. No problems turning are noted. Tandem walk is unremarkable.                Assessment and Plan:  In summary, Shaunak MALIN CERVINI is a very pleasant 50 y.o.-year old male with an underlying medical history of hyperlipidemia, irritable bowel syndrome, kidney stones, reflux disease, fatty liver, and obesity, whose history and physical exam are concerning for obstructive sleep apnea (OSA). I had a long chat with the patient and his wife about my findings and the diagnosis of OSA, its prognosis and treatment options. We talked about medical treatments, surgical interventions and non-pharmacological approaches. I explained in particular the risks and ramifications of untreated moderate to severe OSA, especially with respect to developing cardiovascular disease down the Road, including congestive heart failure, difficult to treat hypertension, cardiac arrhythmias, or stroke. Even type 2 diabetes has, in part, been linked to untreated OSA. Symptoms of untreated OSA include daytime sleepiness, memory problems, mood irritability and mood disorder such as depression and anxiety, lack of energy, as well as recurrent headaches, especially morning headaches. We talked about trying to maintain a healthy lifestyle in general, as well as the importance of weight control. We also talked about the importance of good sleep hygiene. I recommended the following at this time: sleep study.   I explained the sleep test procedure to the patient and also outlined possible surgical and non-surgical treatment options of OSA, including the use of a custom-made dental device (which would require a referral to a  specialist dentist or oral surgeon), upper airway surgical options, such as traditional UPPP or a novel less invasive surgical option in the form of Inspire hypoglossal nerve stimulation (which would involve a referral to an ENT surgeon). I also explained the CPAP treatment option to the patient, who indicated that he would be willing to try CPAP if the need arises. I explained the importance of being compliant with PAP treatment, not only for insurance purposes but  primarily to improve His symptoms, and for the patient's long term health benefit, including to reduce His cardiovascular risks. I answered all their questions today and the patient and his wife were in agreement. I plan to see him back after the sleep study is completed and encouraged them to call with any interim questions, concerns, problems or updates.   Thank you very much for allowing me to participate in the care of this nice patient. If I can be of any further assistance to you please do not hesitate to call me at [phone].  Sincerely,   Star Age, MD, PhD

## 2020-11-10 NOTE — Patient Instructions (Signed)
Thank you for choosing [facility] for your sleep related care! It was nice to meet you both today! I appreciate that you entrust me with your sleep related healthcare concerns. I hope, I was able to address at least some of your concerns today, and that I can help you feel reassured and also get better.    Here is what we discussed today and what we came up with as our plan for you:    Based on your symptoms and your exam I believe you are at risk for obstructive sleep apnea (aka OSA), and I think we should proceed with a sleep study to determine whether you do or do not have OSA and how severe it is. Even, if you have mild OSA, I may want you to consider treatment with CPAP, as treatment of even borderline or mild sleep apnea can result and improvement of symptoms such as sleep disruption, daytime sleepiness, nighttime bathroom breaks, restless leg symptoms, improvement of headache syndromes, even improved mood disorder.   As explained, an attended sleep study meaning you get to stay overnight in the sleep lab, lets [place] monitor sleep-related behaviors such as sleep talking and leg movements in sleep, in addition to monitoring for sleep apnea.  A home sleep test is a screening tool for sleep apnea only, and unfortunately does not help with any other sleep-related diagnoses.  Please remember, the long-term risks and ramifications of untreated moderate to severe obstructive sleep apnea are: increased Cardiovascular disease, including congestive heart failure, stroke, difficult to control hypertension, treatment resistant obesity, arrhythmias, especially irregular heartbeat commonly known as A. Fib. (atrial fibrillation); even type 2 diabetes has been linked to untreated OSA.   Sleep apnea can cause disruption of sleep and sleep deprivation in most cases, which, in turn, can cause recurrent headaches, problems with memory, mood, concentration, focus, and vigilance. Most people with  untreated sleep apnea report excessive daytime sleepiness, which can affect their ability to drive. Please do not drive if you feel sleepy. Patients with sleep apnea can also develop difficulty initiating and maintaining sleep (aka insomnia).   Having sleep apnea may increase your risk for other sleep disorders, including involuntary behaviors sleep such as sleep terrors, sleep talking, sleepwalking.    Having sleep apnea can also increase your risk for restless leg syndrome and leg movements at night.   Please note that untreated obstructive sleep apnea may carry additional perioperative morbidity. Patients with significant obstructive sleep apnea (typically, in the moderate to severe degree) should receive, if possible, perioperative PAP (positive airway pressure) therapy and the surgeons and particularly the anesthesiologists should be informed of the diagnosis and the severity of the sleep disordered breathing.   I will likely see you back after your sleep study to go over the test results and where to go from there. We will call you after your sleep study to advise about the results (most likely, you will hear from [facility], my nurse) and to set up an appointment at the time, as necessary.    Our sleep lab administrative assistant will call you to schedule your sleep study and give you further instructions, regarding the check in process for the sleep study, arrival time, what to bring, when you can expect to leave after the study, etc., and to answer any other logistical questions you may have. If you don't hear back from her by about 2 weeks from now, please feel free to call her direct line at [phone] or you can  call our general clinic number, or email us through My Chart.    

## 2020-11-11 ENCOUNTER — Ambulatory Visit: Payer: 59 | Admitting: Endocrinology

## 2020-11-11 ENCOUNTER — Other Ambulatory Visit: Payer: Self-pay

## 2020-11-11 ENCOUNTER — Encounter: Payer: Self-pay | Admitting: Endocrinology

## 2020-11-11 DIAGNOSIS — R7989 Other specified abnormal findings of blood chemistry: Secondary | ICD-10-CM | POA: Insufficient documentation

## 2020-11-11 LAB — IBC PANEL
Iron: 113 ug/dL (ref 42–165)
Saturation Ratios: 30.8 % (ref 20.0–50.0)
Transferrin: 262 mg/dL (ref 212.0–360.0)

## 2020-11-11 LAB — LUTEINIZING HORMONE: LH: 3.41 m[IU]/mL (ref 1.50–9.30)

## 2020-11-11 LAB — FOLLICLE STIMULATING HORMONE: FSH: 4.6 m[IU]/mL (ref 1.4–18.1)

## 2020-11-11 NOTE — Patient Instructions (Addendum)
Blood tests are requested for you today.  We'll let you know about the results.  Based on the results, I may be able to prescribe for you a pill to increase the testosterone level. Weight loss also helps the testosterone.  Testosterone treatment has risks, including increased or decreased fertility (depending on the type of treatment), hair loss, prostate cancer, benign prostate enlargement, blood clots, liver problems, lower hdl ("good cholesterol"), polycythemia (opposite of anemia), sleep apnea, and behavior changes.

## 2020-11-11 NOTE — Progress Notes (Signed)
Subjective:    Patient ID: [name] [name], male    DOB: 07-06-1971, 50 y.o.   MRN: [id number]  HPI Pt is referred by Dr Raoul Pitch, for hypogonadism.  Pt reports he had puberty at the normal age.  He has 2 biological children, and would like to consider having another child.  He says he has never taken illicit androgens.  He has never had pituitary imaging.  He has never been on any prescribed medication for hypogonadism.  He does not take antiandrogens or opioids.  He denies any h/o infertility, XRT, or genital infection.  He has never had surgery, or a serious injury to the head or genital area. He has no DVT.   He does not consume alcohol excessively.  He reports reduced libido.  Past Medical History:  Diagnosis Date  . Fatty liver   . Fatty liver   . GERD (gastroesophageal reflux disease)    uses Tums- helps at times   . Hyperlipidemia   . IBS (irritable bowel syndrome)   . Kidney stone   . Kidney stones   . Low testosterone   . Obesity   . Snoring     Past Surgical History:  Procedure Laterality Date  . COLONOSCOPY  08/06/2005  . NO PAST SURGERIES      Social History   Socioeconomic History  . Marital status: Married    Spouse name: Anderson Malta  . Number of children: 3  . Years of education: Not on file  . Highest education level: High school graduate  Occupational History  . Occupation: roofer  Tobacco Use  . Smoking status: Never Smoker  . Smokeless tobacco: Never Used  Vaping Use  . Vaping Use: Never used  Substance and Sexual Activity  . Alcohol use: Yes    Alcohol/week: 0.0 standard drinks    Comment: occ  . Drug use: No  . Sexual activity: Yes    Partners: Female    Birth control/protection: None    Comment: Married  Other Topics Concern  . Not on file  Social History Narrative   Married to [place]. Has 3 children Cornelius Moras and [employer] school education. Works as a Theme park manager.   Drinks caffeine, uses herbal remedies.   Wears his seatbelt,  smoke detectors in the house.   No firearms in the house.   Feels safe in his relationships.   Social Determinants of Health   Financial Resource Strain: Not on file  Food Insecurity: Not on file  Transportation Needs: Not on file  Physical Activity: Not on file  Stress: Not on file  Social Connections: Not on file  Intimate Partner Violence: Not on file    Current Outpatient Medications on File Prior to Visit  Medication Sig Dispense Refill  . calcium carbonate (TUMS - DOSED IN MG ELEMENTAL CALCIUM) 500 MG chewable tablet Chew 1 tablet by mouth daily.    Marland Kitchen esomeprazole (NEXIUM) 20 MG capsule Take 1 capsule (20 mg total) by mouth daily at 12 noon. 90 capsule 3  . Multiple Vitamin (MULTIVITAMIN ADULT PO) Take 1 tablet by mouth daily. men's    . Omega-3 Fatty Acids (FISH OIL) 1000 MG CAPS Take by mouth.     Current Facility-Administered Medications on File Prior to Visit  Medication Dose Route Frequency Provider Last Rate Last Admin  . 0.9 %  sodium chloride infusion  500 mL Intravenous Once Pyrtle, Lajuan Lines, MD        No Known Allergies  Family  History  Problem Relation Age of Onset  . Cirrhosis Paternal Grandfather   . Colon cancer Neg Hx   . Colon polyps Neg Hx   . Esophageal cancer Neg Hx   . Rectal cancer Neg Hx   . Stomach cancer Neg Hx   . Other Neg Hx        low testosterone    BP 140/88   Pulse 88   Ht 5\' 7"  (1.702 m)   Wt 248 lb (112.5 kg)   SpO2 94%   BMI 38.84 kg/m     Review of Systems denies numbness, erectile dysfunction, muscle weakness, headache, and sob.  He has chronic depression and weight gain.       Objective:   Physical Exam VS: see vs page GEN: no distress HEAD: head: no deformity eyes: no periorbital swelling, no proptosis external nose and ears are normal NECK: supple, thyroid is not enlarged CHEST WALL: no deformity.  Mild bilat pseudogunecomastia LUNGS: clear to auscultation CV: reg rate and rhythm, no murmur.  GENITALIA: Normal  male testicles, scrotum, and penis MUSCULOSKELETAL: gait is normal and steady EXTEMITIES: no deformity.  no leg edema NEURO:  readily moves all 4's.  sensation is intact to touch on all 4's.   SKIN:  Normal texture and temperature.  No rash or suspicious lesion is visible.  Normal male hair distribution.  NODES:  None palpable at the neck.   PSYCH: alert, well-oriented.  Does not appear anxious nor depressed.    Lab Results  Component Value Date   TESTOSTERONE 193.04 (L) 11/03/2020   Lab Results  Component Value Date   PSA 0.52 09/29/2020   PSA 0.97 09/03/2018   PSA 0.89 09/25/2017   I have reviewed outside records, and summarized: Pt was noted to have low testosterone, and referred here.  He was seen by sleep specialist, and sleep study was ordered.      Assessment & Plan:  Low testosterone, new to me.  uncertain etiology and prognosis. Fertility: in this setting, clomid is preferred rx.  Sleep disorder: we discussed poss worsening of this with rx of low testosterone.    Patient Instructions  Blood tests are requested for you today.  We'll let you know about the results.  Based on the results, I may be able to prescribe for you a pill to increase the testosterone level. Weight loss also helps the testosterone.  Testosterone treatment has risks, including increased or decreased fertility (depending on the type of treatment), hair loss, prostate cancer, benign prostate enlargement, blood clots, liver problems, lower hdl ("good cholesterol"), polycythemia (opposite of anemia), sleep apnea, and behavior changes.

## 2020-11-12 LAB — PROLACTIN: Prolactin: 10 ng/mL (ref 2.0–18.0)

## 2020-11-14 ENCOUNTER — Other Ambulatory Visit: Payer: Self-pay | Admitting: Endocrinology

## 2020-11-14 DIAGNOSIS — R7989 Other specified abnormal findings of blood chemistry: Secondary | ICD-10-CM

## 2020-11-14 LAB — TESTOSTERONE,FREE AND TOTAL
Testosterone, Free: 3.4 pg/mL — ABNORMAL LOW (ref 7.2–24.0)
Testosterone: 118 ng/dL — ABNORMAL LOW (ref 264–916)

## 2020-11-14 MED ORDER — CLOMIPHENE CITRATE 50 MG PO TABS: ORAL_TABLET | ORAL | 3 refills | Status: DC

## 2020-11-14 MED FILL — CLOMIPHENE CITRATE 50 MG TA: 50 | 88 days supply | Qty: 22 | Fill #0

## 2020-11-16 ENCOUNTER — Telehealth: Payer: Self-pay

## 2020-11-16 NOTE — Telephone Encounter (Signed)
LVM for pt to call me back to schedule sleep study  

## 2020-11-24 ENCOUNTER — Telehealth: Payer: Self-pay

## 2020-11-24 NOTE — Telephone Encounter (Signed)
LVM for pt to call me back to schedule sleep study  

## 2020-11-28 ENCOUNTER — Telehealth: Payer: Self-pay

## 2020-11-28 NOTE — Telephone Encounter (Signed)
We have attempted to call the patient two times to schedule sleep study.  Patient has been unavailable at the phone numbers we have on file and has not returned our calls. If patient calls back we will schedule them for their sleep study.  

## 2020-12-28 ENCOUNTER — Ambulatory Visit (INDEPENDENT_AMBULATORY_CARE_PROVIDER_SITE_OTHER): Payer: 59

## 2020-12-28 ENCOUNTER — Other Ambulatory Visit: Payer: Self-pay

## 2020-12-28 DIAGNOSIS — Z23 Encounter for immunization: Secondary | ICD-10-CM

## 2021-02-16 ENCOUNTER — Other Ambulatory Visit (HOSPITAL_COMMUNITY): Payer: Self-pay

## 2021-02-16 MED FILL — Clomiphene Citrate Tab 50 MG: ORAL | 88 days supply | Qty: 22 | Fill #0 | Status: AC

## 2021-05-12 ENCOUNTER — Other Ambulatory Visit (HOSPITAL_COMMUNITY): Payer: Self-pay

## 2021-05-12 MED FILL — Clomiphene Citrate Tab 50 MG: ORAL | 88 days supply | Qty: 22 | Fill #1 | Status: AC

## 2021-05-13 ENCOUNTER — Other Ambulatory Visit (HOSPITAL_COMMUNITY): Payer: Self-pay

## 2021-06-09 ENCOUNTER — Emergency Department (HOSPITAL_BASED_OUTPATIENT_CLINIC_OR_DEPARTMENT_OTHER)
Admission: EM | Admit: 2021-06-09 | Discharge: 2021-06-09 | Disposition: A | Discharge status: Home / Self Care | Payer: 59 | Attending: Emergency Medicine | Admitting: Emergency Medicine

## 2021-06-09 ENCOUNTER — Encounter (HOSPITAL_BASED_OUTPATIENT_CLINIC_OR_DEPARTMENT_OTHER): Payer: Self-pay | Admitting: Emergency Medicine

## 2021-06-09 ENCOUNTER — Other Ambulatory Visit: Payer: Self-pay

## 2021-06-09 ENCOUNTER — Emergency Department (HOSPITAL_BASED_OUTPATIENT_CLINIC_OR_DEPARTMENT_OTHER): Payer: 59

## 2021-06-09 DIAGNOSIS — K5730 Diverticulosis of large intestine without perforation or abscess without bleeding: Secondary | ICD-10-CM | POA: Diagnosis not present

## 2021-06-09 DIAGNOSIS — N200 Calculus of kidney: Secondary | ICD-10-CM | POA: Insufficient documentation

## 2021-06-09 DIAGNOSIS — K219 Gastro-esophageal reflux disease without esophagitis: Secondary | ICD-10-CM | POA: Diagnosis not present

## 2021-06-09 DIAGNOSIS — R109 Unspecified abdominal pain: Secondary | ICD-10-CM | POA: Diagnosis present

## 2021-06-09 DIAGNOSIS — K760 Fatty (change of) liver, not elsewhere classified: Secondary | ICD-10-CM | POA: Diagnosis not present

## 2021-06-09 LAB — URINALYSIS, ROUTINE W REFLEX MICROSCOPIC
Bilirubin Urine: NEGATIVE
Glucose, UA: NEGATIVE mg/dL
Ketones, ur: NEGATIVE mg/dL
Leukocytes,Ua: NEGATIVE
Nitrite: NEGATIVE
Protein, ur: 30 mg/dL — AB
RBC / HPF: >50 RBC/hpf — ABNORMAL HIGH (ref 0–5)
Specific Gravity, Urine: 1.023 (ref 1.005–1.030)
pH: 7 (ref 5.0–8.0)

## 2021-06-09 MED ORDER — KETOROLAC TROMETHAMINE 30 MG/ML IJ SOLN
30.0000 mg | Freq: Once | INTRAMUSCULAR | Status: AC
  Administered 2021-06-09: 30 mg via INTRAMUSCULAR
  Filled 2021-06-09: qty 1

## 2021-06-09 NOTE — ED Triage Notes (Signed)
Pt endorses left sided flank pain since 10:30 this morning. Hx of kidney stones, denies blood in urine.

## 2021-06-09 NOTE — ED Notes (Signed)
Pt verbalizes understanding of discharge instructions. Opportunity for questioning and answers were provided. Armand removed by staff, pt discharged from ED to home. Educated to f/u with PCP.  

## 2021-06-09 NOTE — ED Provider Notes (Signed)
[place] EMERGENCY DEPT Provider Note   CSN: [id number] Arrival date & time: 06/09/21  1300     History Chief Complaint  Patient presents with   Flank Pain    [name] [name] is a 50 y.o. male.  The history is provided by the patient.  Flank Pain This is a new problem. The current episode started 6 to 12 hours ago. The problem has been resolved. Associated symptoms include abdominal pain. Pertinent negatives include no chest pain, no headaches and no shortness of breath.   50 year old male with history of nephrolithiasis, GERD, HLD presenting to the emergency department with left flank pain that was sharp and severe that started earlier this morning around 1030.  [name] [name] denies any gross hematuria.  [name] [name] states that the pain has subsequently resolved since arrival to the emergency department.  [name] [name] denies any fevers or chills.  [name] [name] denies any dysuria.  [name] [name] states that the pain was consistent with prior kidney stones.  Past Medical History:  Diagnosis Date   Fatty liver    Fatty liver    GERD (gastroesophageal reflux disease)    uses Tums- helps at times    Hyperlipidemia    IBS (irritable bowel syndrome)    Kidney stone    Kidney stones    Low testosterone    Obesity    Snoring     Patient Active Problem List   Diagnosis Date Noted   Low testosterone 11/11/2020   Hepatic steatosis 09/17/2016   Gastroesophageal reflux disease without esophagitis 09/17/2016   Obesity (BMI 30-39.9) 06/25/2016   Nephrolithiasis 06/25/2016   Encounter for preventive health examination 06/25/2016    Past Surgical History:  Procedure Laterality Date   COLONOSCOPY  08/06/2005   NO PAST SURGERIES         Family History  Problem Relation Age of Onset   Cirrhosis Paternal Grandfather    Colon cancer Neg Hx    Colon polyps Neg Hx    Esophageal cancer Neg Hx    Rectal cancer Neg Hx    Stomach cancer Neg Hx    Other Neg Hx        low testosterone    Social History    Tobacco Use   Smoking status: Never   Smokeless tobacco: Never  Vaping Use   Vaping Use: Never used  Substance Use Topics   Alcohol use: Yes    Alcohol/week: 0.0 standard drinks    Comment: occ   Drug use: No    Home Medications Prior to Admission medications   Medication Sig Start Date End Date Taking? Authorizing Provider  calcium carbonate (TUMS - DOSED IN MG ELEMENTAL CALCIUM) 500 MG chewable tablet Chew 1 tablet by mouth daily.    [provider]  clomiPHENE (CLOMID) 50 MG tablet TAKE 1/4 TABLET DAILY AS DIRECTED 11/14/20 11/14/21  Renato Shin, MD  esomeprazole (NEXIUM) 20 MG capsule Take 1 capsule (20 mg total) by mouth daily at 12 noon. 09/25/17   Kuneff, Renee A, DO  Multiple Vitamin (MULTIVITAMIN ADULT PO) Take 1 tablet by mouth daily. men's    [provider]  Omega-3 Fatty Acids (FISH OIL) 1000 MG CAPS Take by mouth.    [provider]    Allergies    Patient has no known allergies.  Review of Systems   Review of Systems  Constitutional:  Negative for chills and fever.  HENT:  Negative for ear pain and sore throat.   Eyes:  Negative for pain and  visual disturbance.  Respiratory:  Negative for cough and shortness of breath.   Cardiovascular:  Negative for chest pain and palpitations.  Gastrointestinal:  Positive for abdominal pain. Negative for vomiting.  Genitourinary:  Positive for flank pain. Negative for dysuria and hematuria.  Musculoskeletal:  Negative for arthralgias and back pain.  Skin:  Negative for color change and rash.  Neurological:  Negative for seizures, syncope and headaches.  All other systems reviewed and are negative.  Physical Exam Updated Vital Signs BP (!) 170/102   Pulse 61   Temp 98.2 F (36.8 C) (Oral)   Resp 16   Ht '5\' 7"'$  (1.702 m)   Wt 108.9 kg   SpO2 98%   BMI 37.59 kg/m   Physical Exam Vitals and nursing note reviewed.  Constitutional:      Appearance: [name] [name] is well-developed.  HENT:     Head:  Normocephalic and atraumatic.  Eyes:     Conjunctiva/sclera: Conjunctivae normal.  Cardiovascular:     Rate and Rhythm: Normal rate and regular rhythm.     Heart sounds: No murmur heard. Pulmonary:     Effort: Pulmonary effort is normal. No respiratory distress.     Breath sounds: Normal breath sounds.  Abdominal:     Palpations: Abdomen is soft.     Tenderness: There is no abdominal tenderness. There is no right CVA tenderness, left CVA tenderness, guarding or rebound.  Musculoskeletal:     Cervical back: Neck supple.  Skin:    General: Skin is warm and dry.  Neurological:     Mental Status: [name] [name] is alert.    ED Results / Procedures / Treatments   Labs (all labs ordered are listed, but only abnormal results are displayed) Labs Reviewed  URINALYSIS, ROUTINE W REFLEX MICROSCOPIC - Abnormal; Notable for the following components:      Result Value   APPearance HAZY (*)    Hgb urine dipstick LARGE (*)    Protein, ur 30 (*)    RBC / HPF >50 (*)    All other components within normal limits    EKG None  Radiology CT Renal Stone Study  Result Date: 06/09/2021 CLINICAL DATA:  Left-sided flank pain beginning at 10:30 a.m. today. EXAM: CT ABDOMEN AND PELVIS WITHOUT CONTRAST TECHNIQUE: Multidetector CT imaging of the abdomen and pelvis was performed following the standard protocol without IV contrast. COMPARISON:  CT of the abdomen pelvis 05/21/2009 FINDINGS: Lower chest: Lung bases are clear.  Heart size is normal. Hepatobiliary: Diffuse fatty infiltration of the liver is present. No discrete lesions are present. The common bile duct and gallbladder are normal. Pancreas: Unremarkable. No pancreatic ductal dilatation or surrounding inflammatory changes. Spleen: Normal in size without focal abnormality. Adrenals/Urinary Tract: The adrenal glands are within bilaterally. A 2.5 mm nonobstructing stone is present in the midportion of the right kidney. A 3 mm parenchymal calcification is present  near the lower pole of the left kidney. A 2.5 mm nonobstructing stone is present at the lower pole of the left kidney. A 3.5 mm nonobstructing stone is present at the upper pole of the left kidney. An additional punctate calcification is present in the upper pole. Stomach/Bowel: Stomach and duodenum are within normal limits. Small bowel is unremarkable. Terminal ileum is normal. Appendix is visualized and within normal limits. Ascending and transverse colon are within normal limits. Diverticular changes are present the distal descending and sigmoid colon. No inflammatory changes are present. Vascular/Lymphatic: No significant vascular findings are present. No enlarged abdominal  or pelvic lymph nodes. Reproductive: Prostate is unremarkable. Other: No abdominal wall hernia or abnormality. No abdominopelvic ascites. Musculoskeletal: No acute or significant osseous findings. IMPRESSION: 1. Bilateral nonobstructing nephrolithiasis. 2. Hepatic steatosis. 3. Descending and sigmoid diverticulosis without diverticulitis. Electronically Signed   By: San Morelle M.D.   On: 06/09/2021 14:49    Procedures Procedures   Medications Ordered in ED Medications  ketorolac (TORADOL) 30 MG/ML injection 30 mg (30 mg Intramuscular Given 06/09/21 1906)    ED Course  I have reviewed the triage vital signs and the nursing notes.  Pertinent labs & imaging results that were available during my care of the patient were reviewed by me and considered in my medical decision making (see chart for details).    MDM Rules/Calculators/A&P                           50 year old male presenting to the emergency department with left flank pain that was sharp and severe, consistent with prior episodes of nephrolithiasis.  Patient's pain has since resolved since arrival to the emergency department.  Symptoms consistent with likely nephrolithiasis.  No dysuria or gross hematuria.  No fevers or chills.  His abdomen is soft,  nontender, nondistended on physical exam.  Low suspicion for acute intra-abdominal abnormality at this time.  CT imaging was performed with bilateral nonobstructing nephrolithiasis present.  Descending and sigmoid diverticulosis without diverticulitis present.  Urinalysis present with RBCs without evidence of UTI.  No left lower quadrant tenderness to palpation on reassessment.  The patient remained asymptomatic in the emergency department.  Symptoms are most consistent with likely passed kidney stone.  Patient was informed of the likely diagnosis and ultimately deemed stable for discharge with continued outpatient management. Final Clinical Impression(s) / ED Diagnoses Final diagnoses:  Nephrolithiasis  Flank pain    Rx / DC Orders ED Discharge Orders     None        Regan Lemming, MD 06/10/21 1722

## 2022-05-31 IMAGING — CT CT RENAL STONE PROTOCOL
2 of 4 series · 16 of 46 positions shown, 18 images · non-contrast
Comparison: CT of the abdomen pelvis 05/21/2009

CLINICAL DATA: Left-sided flank pain beginning at [DATE] a.m. today.

EXAM:
CT ABDOMEN AND PELVIS WITHOUT CONTRAST
TECHNIQUE: Multidetector CT imaging of the abdomen and pelvis was performed
following the standard protocol without IV contrast.

[Series 2: stone full · axial · 0.77mm/px · z∈[+679,+1139]mm · 13 of 101 slices shown, 15 images]
[im 5/101  soft-tissue]
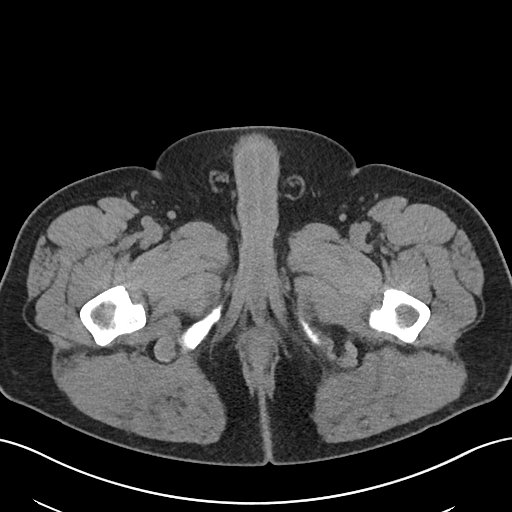
[im 5/101  bone]
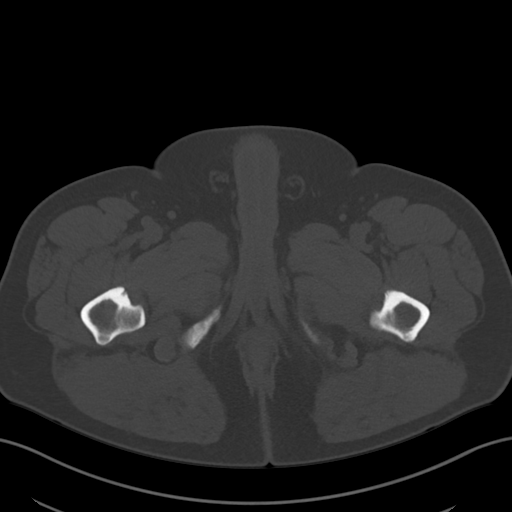
[im 13/101  soft-tissue]
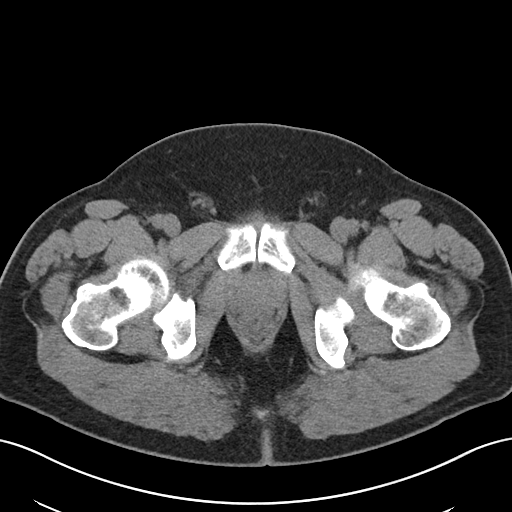
[im 21/101  soft-tissue]
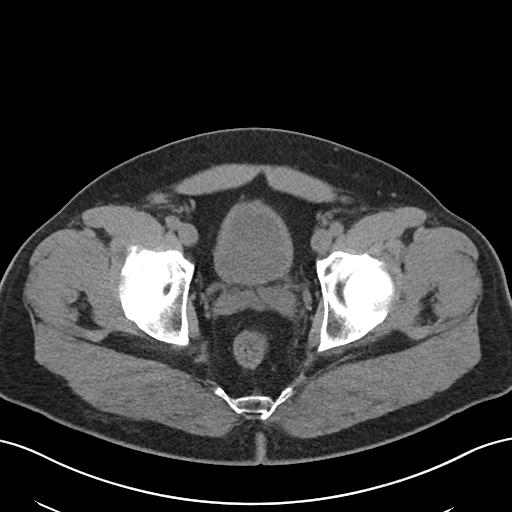
[im 29/101  soft-tissue]
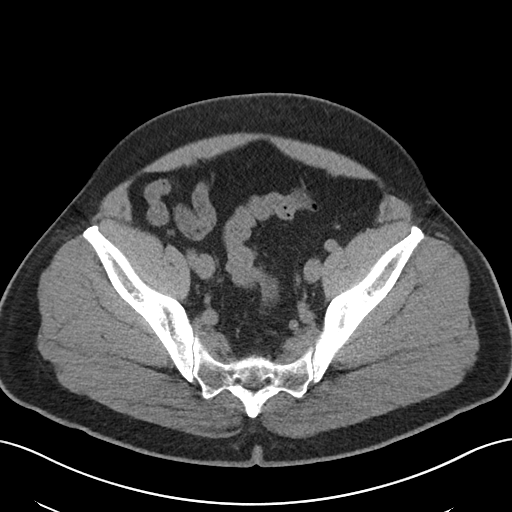
[im 37/101  soft-tissue]
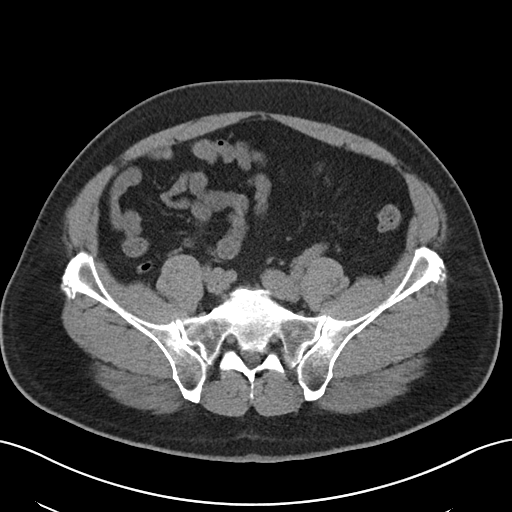
[im 45/101  soft-tissue]
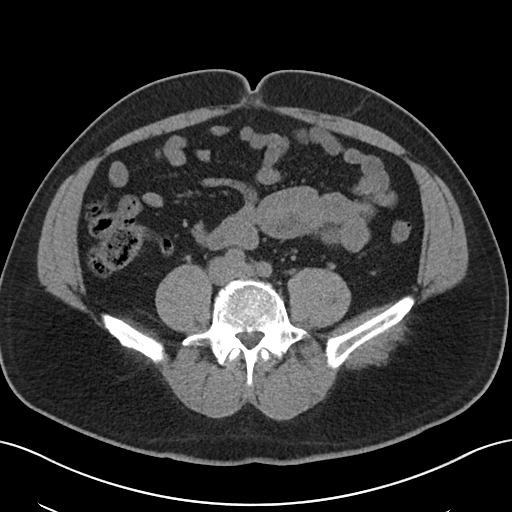
[im 53/101  soft-tissue]
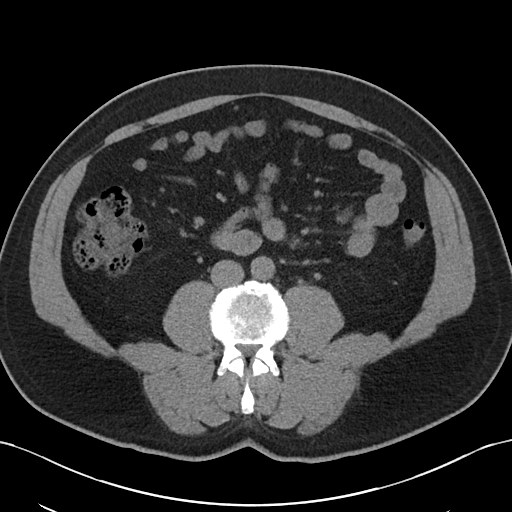
[im 57/101  soft-tissue]
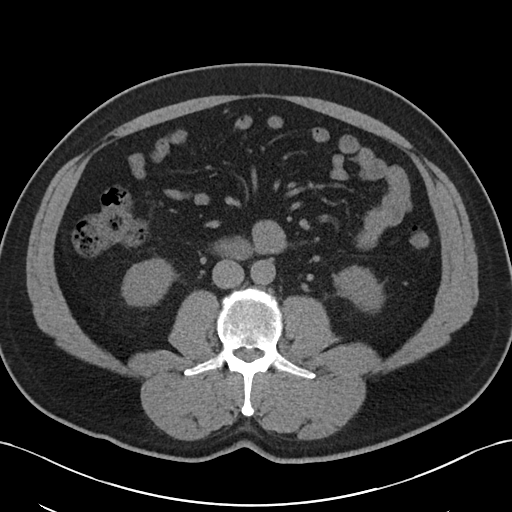
[im 65/101  soft-tissue]
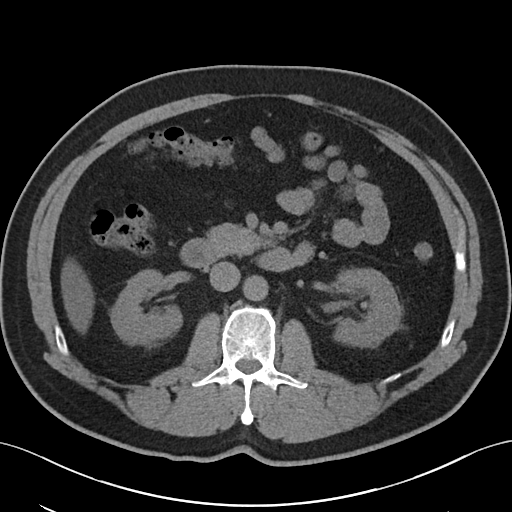
[im 65/101  bone]
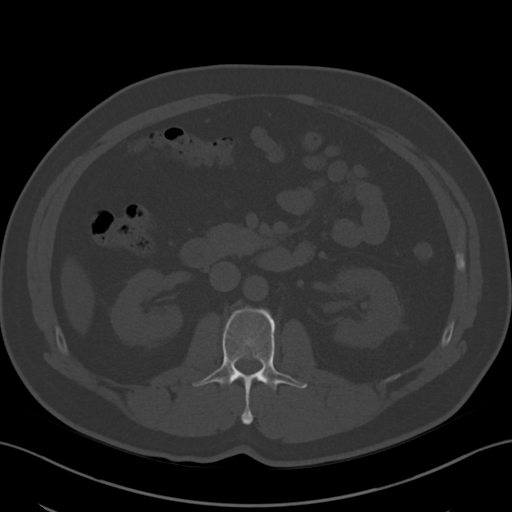
[im 73/101  soft-tissue]
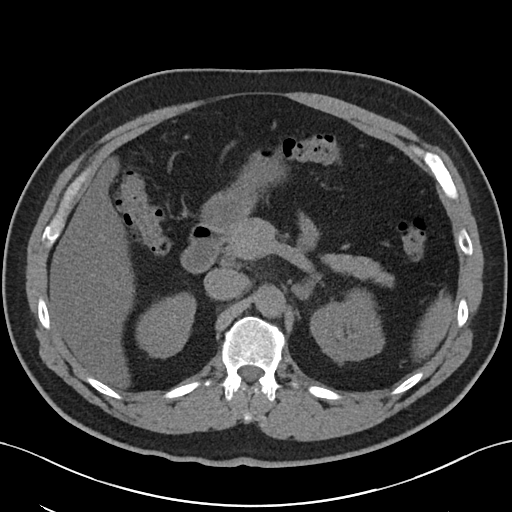
[im 81/101  soft-tissue]
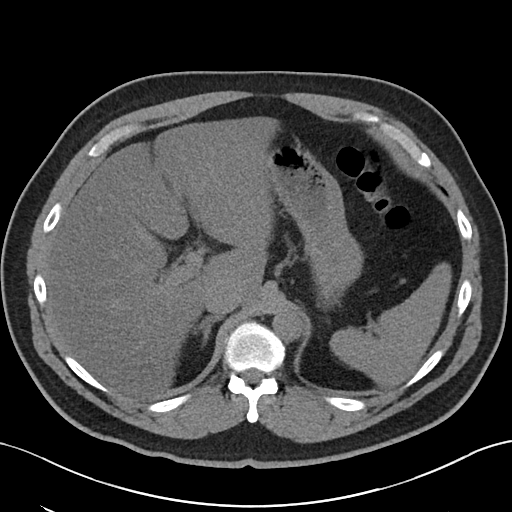
[im 89/101  soft-tissue]
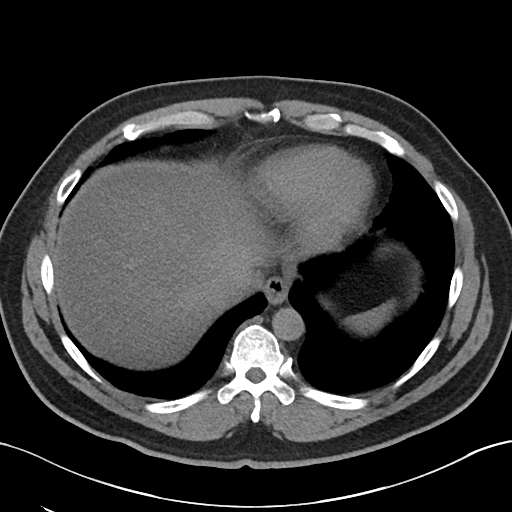
[im 97/101  soft-tissue]
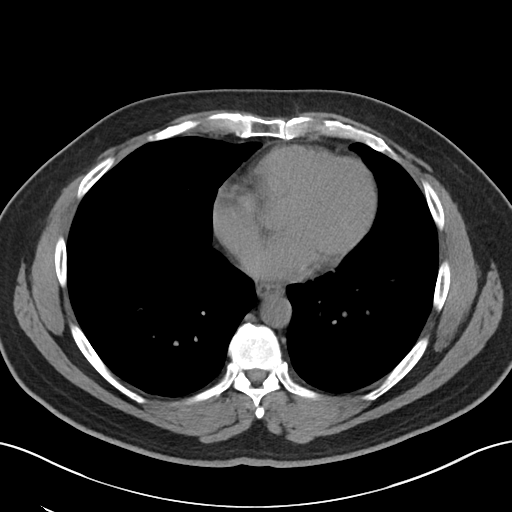

[Series 5: coronal · coronal · 0.89mm/px · 3 of 111 slices shown]
[im 37/111  soft-tissue]
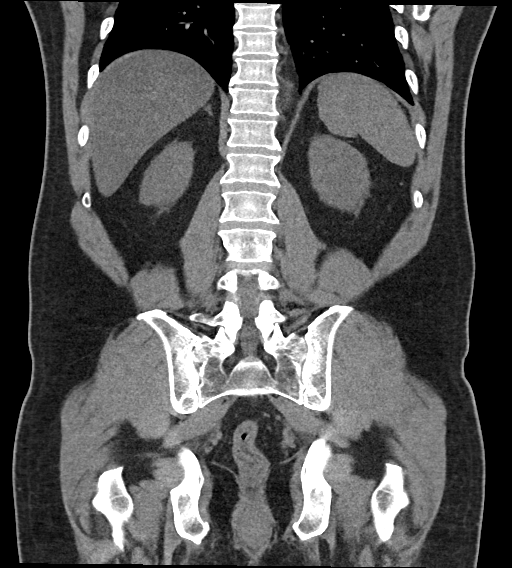
[im 49/111  soft-tissue]
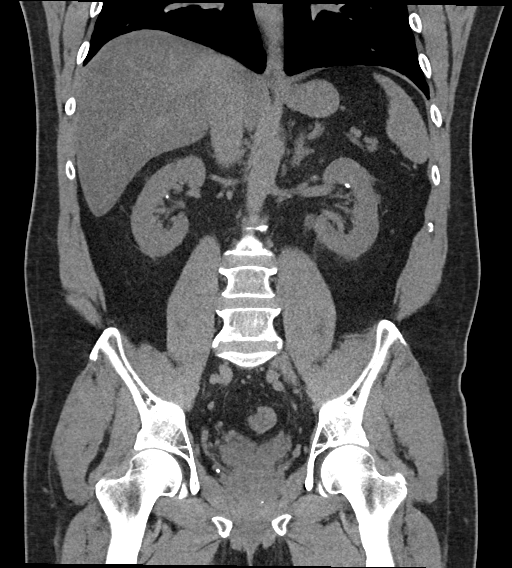
[im 62/111  soft-tissue]
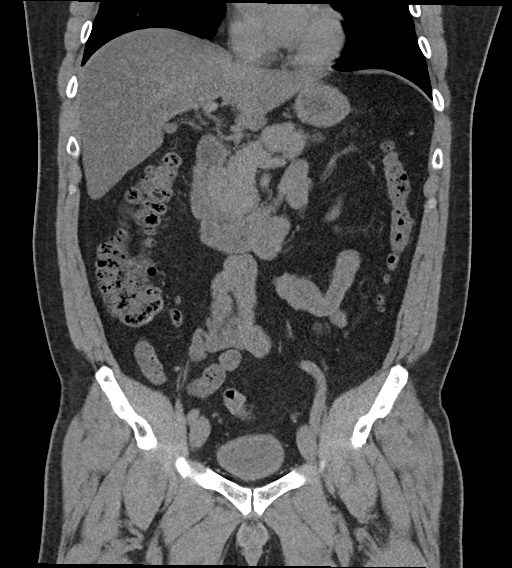

[16 of 46 positions shown; findings below may reference images not displayed]

FINDINGS: Lower chest: Lung bases are clear.  Heart size is normal.

Hepatobiliary: Diffuse fatty infiltration of the liver is present.
No discrete lesions are present. The common bile duct and
gallbladder are normal.

Pancreas: Unremarkable. No pancreatic ductal dilatation or
surrounding inflammatory changes.

Spleen: Normal in size without focal abnormality.

Adrenals/Urinary Tract: The adrenal glands are within bilaterally. A
2.5 mm nonobstructing stone is present in the midportion of the
right kidney. A 3 mm parenchymal calcification is present near the
lower pole of the left kidney. A 2.5 mm nonobstructing stone is
present at the lower pole of the left kidney. A 3.5 mm
nonobstructing stone is present at the upper pole of the left
kidney. An additional punctate calcification is present in the upper
pole.

Stomach/Bowel: Stomach and duodenum are within normal limits. Small
bowel is unremarkable. Terminal ileum is normal. Appendix is
visualized and within normal limits. Ascending and transverse colon
are within normal limits. Diverticular changes are present the
distal descending and sigmoid colon. No inflammatory changes are
present.

Vascular/Lymphatic: No significant vascular findings are present. No
enlarged abdominal or pelvic lymph nodes.

Reproductive: Prostate is unremarkable.

Other: No abdominal wall hernia or abnormality. No abdominopelvic
ascites.

Musculoskeletal: No acute or significant osseous findings.
IMPRESSION: 1. Bilateral nonobstructing nephrolithiasis.
2. Hepatic steatosis.
3. Descending and sigmoid diverticulosis without diverticulitis.

## 2022-06-28 ENCOUNTER — Encounter: Payer: Self-pay | Admitting: Internal Medicine
